# Patient Record
Sex: Female | Born: 1980 | Hispanic: No | Marital: Married | Smoking: Never smoker
Health system: Southern US, Community
[De-identification: ages and names within clinical notes are randomized; demographics above are authoritative.]

## PROBLEM LIST (undated history)

## (undated) ENCOUNTER — Inpatient Hospital Stay (HOSPITAL_COMMUNITY): Payer: Self-pay

## (undated) DIAGNOSIS — B999 Unspecified infectious disease: Secondary | ICD-10-CM

## (undated) DIAGNOSIS — O98419 Viral hepatitis complicating pregnancy, unspecified trimester: Secondary | ICD-10-CM

## (undated) DIAGNOSIS — B181 Chronic viral hepatitis B without delta-agent: Secondary | ICD-10-CM

## (undated) HISTORY — PX: NO PAST SURGERIES: SHX2092

---

## 2010-03-15 NOTE — L&D Delivery Note (Signed)
Delivery Note At 11:11 PM a viable female was delivered via Vaginal, Spontaneous Delivery (Presentation: LOA with tight nuchal cord  ).  APGAR: 6, 9; weight 7 lb 14.8 oz (3595 g).   Placenta status:spontaneous, intact, 3VC.  Cord:3VC  with the following complications: none  Cord pH: 7.23  Anesthesia:  None Episiotomy: none  Lacerations: none Est. Blood Loss (mL): 350 ml  Mom to postpartum.  Baby to nursery-stable.  Shelvy Perazzo 10/10/2010, 11:25 PM

## 2010-07-29 ENCOUNTER — Other Ambulatory Visit: Payer: Self-pay | Admitting: Physician Assistant

## 2010-07-29 ENCOUNTER — Other Ambulatory Visit: Payer: Self-pay | Admitting: Family Medicine

## 2010-07-29 DIAGNOSIS — Z3689 Encounter for other specified antenatal screening: Secondary | ICD-10-CM

## 2010-07-29 DIAGNOSIS — Z331 Pregnant state, incidental: Secondary | ICD-10-CM

## 2010-07-29 DIAGNOSIS — O0930 Supervision of pregnancy with insufficient antenatal care, unspecified trimester: Secondary | ICD-10-CM

## 2010-07-29 LAB — RUBELLA ANTIBODY, IGM: Rubella: IMMUNE

## 2010-07-29 LAB — STREP B DNA PROBE: GBS: NEGATIVE

## 2010-07-29 LAB — POCT URINALYSIS DIP (DEVICE)
Glucose, UA: NEGATIVE mg/dL
Nitrite: NEGATIVE
Protein, ur: NEGATIVE mg/dL
Specific Gravity, Urine: 1.025 (ref 1.005–1.030)
Urobilinogen, UA: 0.2 mg/dL (ref 0.0–1.0)

## 2010-07-29 LAB — RPR: RPR: NONREACTIVE

## 2010-07-29 LAB — HEPATITIS B SURFACE ANTIGEN: Hepatitis B Surface Ag: POSITIVE

## 2010-07-29 LAB — TYPE AND SCREEN: Antibody Screen: NEGATIVE

## 2010-07-30 ENCOUNTER — Ambulatory Visit (HOSPITAL_COMMUNITY)
Admission: RE | Admit: 2010-07-30 | Discharge: 2010-07-30 | Disposition: A | Discharge status: Home / Self Care | Payer: Medicaid Other | Source: Ambulatory Visit | Attending: Physician Assistant | Admitting: Physician Assistant

## 2010-07-30 DIAGNOSIS — O0930 Supervision of pregnancy with insufficient antenatal care, unspecified trimester: Secondary | ICD-10-CM | POA: Insufficient documentation

## 2010-07-30 DIAGNOSIS — Z1389 Encounter for screening for other disorder: Secondary | ICD-10-CM | POA: Insufficient documentation

## 2010-07-30 DIAGNOSIS — O358XX0 Maternal care for other (suspected) fetal abnormality and damage, not applicable or unspecified: Secondary | ICD-10-CM | POA: Insufficient documentation

## 2010-07-30 DIAGNOSIS — Z3689 Encounter for other specified antenatal screening: Secondary | ICD-10-CM

## 2010-07-30 DIAGNOSIS — Z363 Encounter for antenatal screening for malformations: Secondary | ICD-10-CM | POA: Insufficient documentation

## 2010-08-05 ENCOUNTER — Other Ambulatory Visit: Payer: Self-pay | Admitting: Obstetrics and Gynecology

## 2010-08-05 DIAGNOSIS — O0930 Supervision of pregnancy with insufficient antenatal care, unspecified trimester: Secondary | ICD-10-CM

## 2010-08-05 LAB — POCT URINALYSIS DIP (DEVICE)
Bilirubin Urine: NEGATIVE
Glucose, UA: NEGATIVE mg/dL
Hgb urine dipstick: NEGATIVE
Nitrite: NEGATIVE
Urobilinogen, UA: 0.2 mg/dL (ref 0.0–1.0)

## 2010-08-19 ENCOUNTER — Other Ambulatory Visit: Payer: Self-pay | Admitting: Obstetrics and Gynecology

## 2010-08-19 DIAGNOSIS — O0930 Supervision of pregnancy with insufficient antenatal care, unspecified trimester: Secondary | ICD-10-CM

## 2010-08-19 LAB — POCT URINALYSIS DIP (DEVICE)
Bilirubin Urine: NEGATIVE
Glucose, UA: NEGATIVE mg/dL
Ketones, ur: NEGATIVE mg/dL
Nitrite: NEGATIVE
pH: 6 (ref 5.0–8.0)

## 2010-08-26 ENCOUNTER — Other Ambulatory Visit: Payer: Self-pay | Admitting: Family

## 2010-08-26 DIAGNOSIS — O0930 Supervision of pregnancy with insufficient antenatal care, unspecified trimester: Secondary | ICD-10-CM

## 2010-08-26 DIAGNOSIS — O0940 Supervision of pregnancy with grand multiparity, unspecified trimester: Secondary | ICD-10-CM

## 2010-08-26 LAB — POCT URINALYSIS DIP (DEVICE)
Glucose, UA: NEGATIVE mg/dL
Ketones, ur: NEGATIVE mg/dL
Protein, ur: NEGATIVE mg/dL
Specific Gravity, Urine: 1.02 (ref 1.005–1.030)

## 2010-09-09 ENCOUNTER — Other Ambulatory Visit: Payer: Self-pay | Admitting: Obstetrics and Gynecology

## 2010-09-09 DIAGNOSIS — O0940 Supervision of pregnancy with grand multiparity, unspecified trimester: Secondary | ICD-10-CM

## 2010-09-09 DIAGNOSIS — O0930 Supervision of pregnancy with insufficient antenatal care, unspecified trimester: Secondary | ICD-10-CM

## 2010-09-09 LAB — POCT URINALYSIS DIP (DEVICE)
Bilirubin Urine: NEGATIVE
Glucose, UA: NEGATIVE mg/dL
Hgb urine dipstick: NEGATIVE
Specific Gravity, Urine: 1.025 (ref 1.005–1.030)
Urobilinogen, UA: 0.2 mg/dL (ref 0.0–1.0)

## 2010-09-23 ENCOUNTER — Other Ambulatory Visit: Payer: Self-pay | Admitting: Obstetrics and Gynecology

## 2010-09-23 DIAGNOSIS — O0930 Supervision of pregnancy with insufficient antenatal care, unspecified trimester: Secondary | ICD-10-CM

## 2010-09-23 DIAGNOSIS — O0940 Supervision of pregnancy with grand multiparity, unspecified trimester: Secondary | ICD-10-CM

## 2010-09-23 LAB — POCT URINALYSIS DIP (DEVICE)
Bilirubin Urine: NEGATIVE
Glucose, UA: NEGATIVE mg/dL
Ketones, ur: NEGATIVE mg/dL
Nitrite: NEGATIVE
pH: 6.5 (ref 5.0–8.0)

## 2010-09-30 ENCOUNTER — Other Ambulatory Visit: Payer: Self-pay | Admitting: Obstetrics and Gynecology

## 2010-09-30 DIAGNOSIS — O0940 Supervision of pregnancy with grand multiparity, unspecified trimester: Secondary | ICD-10-CM

## 2010-09-30 DIAGNOSIS — O0930 Supervision of pregnancy with insufficient antenatal care, unspecified trimester: Secondary | ICD-10-CM

## 2010-09-30 LAB — POCT URINALYSIS DIP (DEVICE)
Bilirubin Urine: NEGATIVE
Ketones, ur: NEGATIVE mg/dL
Specific Gravity, Urine: 1.02 (ref 1.005–1.030)
pH: 6 (ref 5.0–8.0)

## 2010-10-07 ENCOUNTER — Ambulatory Visit (INDEPENDENT_AMBULATORY_CARE_PROVIDER_SITE_OTHER): Payer: Medicaid Other | Admitting: Family Medicine

## 2010-10-07 ENCOUNTER — Other Ambulatory Visit: Payer: Self-pay | Admitting: Physician Assistant

## 2010-10-07 DIAGNOSIS — O0930 Supervision of pregnancy with insufficient antenatal care, unspecified trimester: Secondary | ICD-10-CM

## 2010-10-07 DIAGNOSIS — O09299 Supervision of pregnancy with other poor reproductive or obstetric history, unspecified trimester: Secondary | ICD-10-CM

## 2010-10-07 DIAGNOSIS — O480 Post-term pregnancy: Secondary | ICD-10-CM

## 2010-10-07 LAB — POCT URINALYSIS DIP (DEVICE)
Glucose, UA: NEGATIVE mg/dL
Hgb urine dipstick: NEGATIVE
Ketones, ur: NEGATIVE mg/dL
Specific Gravity, Urine: 1.025 (ref 1.005–1.030)

## 2010-10-10 ENCOUNTER — Encounter (HOSPITAL_COMMUNITY): Payer: Self-pay

## 2010-10-10 ENCOUNTER — Inpatient Hospital Stay (HOSPITAL_COMMUNITY)
Admission: AD | Admit: 2010-10-10 | Discharge: 2010-10-12 | DRG: 775 | Disposition: A | Discharge status: Home / Self Care | Payer: Medicaid Other | Source: Ambulatory Visit | Attending: Obstetrics & Gynecology | Admitting: Obstetrics & Gynecology

## 2010-10-10 HISTORY — DX: Unspecified infectious disease: B99.9

## 2010-10-10 HISTORY — DX: Viral hepatitis complicating pregnancy, unspecified trimester: O98.419

## 2010-10-10 HISTORY — DX: Chronic viral hepatitis B without delta-agent: B18.1

## 2010-10-10 LAB — CBC
MCV: 87.8 fL (ref 78.0–100.0)
Platelets: 156 10*3/uL (ref 150–400)
RDW: 13.7 % (ref 11.5–15.5)
WBC: 7.7 10*3/uL (ref 4.0–10.5)

## 2010-10-10 LAB — STREP B DNA PROBE: GBS: NEGATIVE

## 2010-10-10 MED ORDER — TERBUTALINE SULFATE 1 MG/ML IJ SOLN: 0.2500 mg | Freq: Once | INTRAMUSCULAR | Status: AC | PRN

## 2010-10-10 MED ORDER — OXYTOCIN 20 UNITS IN LACTATED RINGERS INFUSION - SIMPLE: 1.0000 m[IU]/min | INTRAVENOUS | Status: DC

## 2010-10-10 MED ORDER — LIDOCAINE HCL (PF) 1 % IJ SOLN
30.0000 mL | INTRAMUSCULAR | Status: DC | PRN
  Filled 2010-10-10 (×2): qty 30

## 2010-10-10 MED ORDER — OXYCODONE-ACETAMINOPHEN 5-325 MG PO TABS: 2.0000 | ORAL_TABLET | ORAL | Status: DC | PRN

## 2010-10-10 MED ORDER — OXYTOCIN 10 UNIT/ML IJ SOLN
INTRAMUSCULAR | Status: AC
  Filled 2010-10-10: qty 2

## 2010-10-10 MED ORDER — ONDANSETRON HCL 4 MG/2ML IJ SOLN: 4.0000 mg | Freq: Four times a day (QID) | INTRAMUSCULAR | Status: DC | PRN

## 2010-10-10 MED ORDER — FLEET ENEMA 7-19 GM/118ML RE ENEM: 1.0000 | ENEMA | RECTAL | Status: DC | PRN

## 2010-10-10 MED ORDER — ACETAMINOPHEN 325 MG PO TABS: 650.0000 mg | ORAL_TABLET | ORAL | Status: DC | PRN

## 2010-10-10 MED ORDER — OXYTOCIN 20 UNITS IN LACTATED RINGERS INFUSION - SIMPLE
125.0000 mL/h | Freq: Once | INTRAVENOUS | Status: AC
  Administered 2010-10-10: 500 mL/h via INTRAVENOUS
  Filled 2010-10-10: qty 1000

## 2010-10-10 MED ORDER — CITRIC ACID-SODIUM CITRATE 334-500 MG/5ML PO SOLN: 30.0000 mL | ORAL | Status: DC | PRN

## 2010-10-10 MED ORDER — IBUPROFEN 600 MG PO TABS: 600.0000 mg | ORAL_TABLET | Freq: Four times a day (QID) | ORAL | Status: DC | PRN

## 2010-10-10 MED ORDER — LACTATED RINGERS IV SOLN
INTRAVENOUS | Status: DC
  Administered 2010-10-10: 20:00:00 via INTRAVENOUS

## 2010-10-10 MED ORDER — LACTATED RINGERS IV SOLN: 500.0000 mL | INTRAVENOUS | Status: DC | PRN

## 2010-10-10 NOTE — H&P (Signed)
Renee Zavala is a 30 y.o. female [id number] with IUP at [redacted]w[redacted]d presenting for active labor. Pt states she has been having regular, every 2-5 minutes, associated with none vaginal bleeding, intact, with active.  She desires to Depo-Provera. She desires to breast and bottle feed.  [facility] since 28.2 wks  Prenatal History/Complications: Grand multiparity Hep B +  Past Medical History: Past Medical History  Diagnosis Date  . Hepatitis B surface antigen positive, currently pregnant   . Infection     Past Surgical History: Past Surgical History  Procedure Date  . No past surgeries     Obstetrical History: OB History    Grav Para Term Preterm Abortions TAB SAB Ect Mult Living   6 5 5  0 0 0 0 0 0 5      Gynecological History: No STIs, or HSV  Social History: History   Social History  . Marital Status: Married    Spouse Name: N/A    Number of Children: N/A  . Years of Education: N/A   Social History Main Topics  . Smoking status: Never Smoker   . Smokeless tobacco: None  . Alcohol Use: No  . Drug Use: No  . Sexually Active: Yes   Other Topics Concern  . None   Social History Narrative  . None    Family History: No family history on file.  Allergies: No Known Allergies  No prescriptions prior to admission    Review of Systems - Negative except per HPI, no s/sx of PreE.   Blood pressure 128/95, pulse 105, temperature 98.8 F (37.1 C), temperature source Oral, resp. rate 20, last menstrual period 12/30/2009. General appearance: alert and cooperative Lungs: clear to auscultation bilaterally Heart: regular rate and rhythm, S1, S2 normal, no murmur, click, rub or gallop Abdomen: soft, non-tender; bowel sounds normal; no masses,  no organomegaly and gravid, size cwd, vertex by leopolds, EFW 6.5lbs-7lbs by leopolds. Extremities: extremities normal, atraumatic, no cyanosis or edema Skin: Skin color, texture, turgor normal. No rashes or lesions cephalic FHTs:  150 bpm, mod var, no accel, var decel with contractions, cat 2 Frequency: Every 2-3 minutes Dilation: 8.5 Effacement (%): 100 Station: -1 Exam by:: LCarpenter,RN   Prenatal labs: ABO, Rh:A pos   Antibody: Negative (05/16 0000) Rubella: immune   RPR: Nonreactive (05/16 0000)  HBsAg: Positive (05/16 0000)  HIV: Non-reactive (05/16 0000)  GBS: Negative (07/28 0000)  1 hr Glucola 92 Genetic screening  Anatomy US nl   Assessment: Renee Zavala is a 30 y.o. [id number] with an IUP at [redacted]w[redacted]d presenting for active labor.  Plan: 1. Admit to labor and delivery for expectant management.    Suzanne Kho 10/10/2010, 8:17 PM

## 2010-10-10 NOTE — Progress Notes (Signed)
Arrived to Rm 167 from admitting.  Had arrived to [facility] via EMS.  Met family in hallway & they accompanied to L&D.  Initial FHR 130s.  Admitted to [facility] @ 1920.  See paper strip for tracing between 1855-1920.

## 2010-10-10 NOTE — Progress Notes (Signed)
Renee Zavala is a 30 y.o. [id number] at [redacted]w[redacted]d admitted for active labor  Subjective: Feeling pressure  Objective: BP 103/67  Pulse 99  Temp(Src) 98.8 F (37.1 C) (Oral)  Resp 20  Ht 5\' 3"  (1.6 m)  Wt 65.772 kg (145 lb)  BMI 25.69 kg/m2  LMP 12/30/2009      FHT:  150 bpm, mod var, no accels, +var decels with contractions UC:   regular, every 2-3 minutes SVE:   Dilation: 9 Effacement (%): 90 Station: 0 Exam by:: Dr. Orvan Falconer  Labs: Lab Results  Component Value Date   WBC 7.7 10/10/2010   HGB 10.9* 10/10/2010   HCT 32.5* 10/10/2010   MCV 87.8 10/10/2010   PLT 156 10/10/2010    Assessment / Plan: Spontaneous labor, progressing normally  Labor: Progressing normally and s/p SROM clr Fetal Wellbeing:  Category II Pain Control:  Labor support without medications I/D:  n/a Anticipated MOD:  NSVD  Renee Zavala 10/10/2010, 9:38 PM

## 2010-10-10 NOTE — Treatment Plan (Signed)
Report & pt care to Emilio Math, RN

## 2010-10-10 NOTE — Treatment Plan (Signed)
[employer] # [phone] (Swahili) used for admission

## 2010-10-10 NOTE — Plan of Care (Signed)
Problem: Consults Goal: Birthing Suites Patient Information Press F2 to bring up selections list   Pt > [redacted] weeks EGA     

## 2010-10-10 NOTE — Progress Notes (Signed)
Renee Zavala is a 30 y.o. [id number] at [redacted]w[redacted]d admitted for active labor  Subjective: Pt feeling some pressure  Objective: BP 103/67  Pulse 99  Temp(Src) 98.8 F (37.1 C) (Oral)  Resp 20  Ht 5\' 3"  (1.6 m)  Wt 65.772 kg (145 lb)  BMI 25.69 kg/m2  LMP 12/30/2009      FHT:  FHR: 150 bpm, variability: moderate,  accelerations:  Abscent,  decelerations:  Present early decel with contractions.  UC:   Unsure due to pt ambulation.  SVE:   Dilation: 9 Effacement (%): 90 Station: 0 Exam by:: Dr. Orvan Falconer  Labs: Lab Results  Component Value Date   WBC 7.7 10/10/2010   HGB 10.9* 10/10/2010   HCT 32.5* 10/10/2010   MCV 87.8 10/10/2010   PLT 156 10/10/2010    Assessment / Plan: Spontaneous labor, progressing normally  Labor: protracted active phase will place IUPC. to monitor contractions.  Fetal Wellbeing:  Category II Pain Control:  Labor support without medications I/D:  n/a Anticipated MOD:  NSVD  Renee Zavala 10/10/2010, 10:28 PM

## 2010-10-10 NOTE — Progress Notes (Signed)
Pt feels the urge to push

## 2010-10-11 MED ORDER — TETANUS-DIPHTH-ACELL PERTUSSIS 5-2.5-18.5 LF-MCG/0.5 IM SUSP
0.5000 mL | Freq: Once | INTRAMUSCULAR | Status: AC
  Administered 2010-10-11: 0.5 mL via INTRAMUSCULAR
  Filled 2010-10-11: qty 0.5

## 2010-10-11 MED ORDER — DIPHENHYDRAMINE HCL 25 MG PO CAPS: 25.0000 mg | ORAL_CAPSULE | Freq: Four times a day (QID) | ORAL | Status: DC | PRN

## 2010-10-11 MED ORDER — SENNOSIDES-DOCUSATE SODIUM 8.6-50 MG PO TABS
1.0000 | ORAL_TABLET | Freq: Every day | ORAL | Status: DC
  Administered 2010-10-11: 1 via ORAL

## 2010-10-11 MED ORDER — IBUPROFEN 600 MG PO TABS
600.0000 mg | ORAL_TABLET | Freq: Four times a day (QID) | ORAL | Status: DC
  Administered 2010-10-11 – 2010-10-12 (×5): 600 mg via ORAL
  Filled 2010-10-11 (×5): qty 1

## 2010-10-11 MED ORDER — SODIUM CHLORIDE 0.9 % IV SOLN: 250.0000 mL | INTRAVENOUS | Status: DC

## 2010-10-11 MED ORDER — SIMETHICONE 80 MG PO CHEW: 80.0000 mg | CHEWABLE_TABLET | ORAL | Status: DC | PRN

## 2010-10-11 MED ORDER — LANOLIN HYDROUS EX OINT: TOPICAL_OINTMENT | CUTANEOUS | Status: DC | PRN

## 2010-10-11 MED ORDER — SODIUM CHLORIDE 0.9 % IJ SOLN: 3.0000 mL | INTRAMUSCULAR | Status: DC | PRN

## 2010-10-11 MED ORDER — FERROUS SULFATE 325 (65 FE) MG PO TABS
325.0000 mg | ORAL_TABLET | Freq: Two times a day (BID) | ORAL | Status: DC
  Administered 2010-10-11 – 2010-10-12 (×3): 325 mg via ORAL
  Filled 2010-10-11 (×3): qty 1

## 2010-10-11 MED ORDER — BENZOCAINE-MENTHOL 20-0.5 % EX AERO: 1.0000 "application " | INHALATION_SPRAY | CUTANEOUS | Status: DC | PRN

## 2010-10-11 MED ORDER — SODIUM CHLORIDE 0.9 % IJ SOLN: 3.0000 mL | Freq: Two times a day (BID) | INTRAMUSCULAR | Status: DC

## 2010-10-11 MED ORDER — PRENATAL PLUS 27-1 MG PO TABS
1.0000 | ORAL_TABLET | Freq: Every day | ORAL | Status: DC
  Administered 2010-10-11 – 2010-10-12 (×2): 1 via ORAL
  Filled 2010-10-11 (×2): qty 1

## 2010-10-11 MED ORDER — OXYCODONE-ACETAMINOPHEN 5-325 MG PO TABS
1.0000 | ORAL_TABLET | ORAL | Status: DC | PRN
  Administered 2010-10-11: 1 via ORAL
  Filled 2010-10-11 (×3): qty 2

## 2010-10-11 MED ORDER — WITCH HAZEL-GLYCERIN EX PADS: MEDICATED_PAD | CUTANEOUS | Status: DC | PRN

## 2010-10-11 NOTE — Progress Notes (Signed)
SVD female, apgar 6/9

## 2010-10-11 NOTE — Progress Notes (Signed)
Post Partum Day 1 (early) Subjective: no complaints and tolerating PO, normal lochia, absent BM, absent flatus, plans to breastfeed, plans to bottle feed, Depo-Provera  Objective: Blood pressure 92/57, pulse 70, temperature 98.8 F (37.1 C), temperature source Oral, resp. rate 20, height 5\' 3"  (1.6 m), weight 65.772 kg (145 lb), last menstrual period 12/30/2009, unknown if currently breastfeeding.  Physical Exam:  General: alert and cooperative Lochia: appropriate Chest: CTAB Heart: RRR no m/r/g Abdomen: +BS, soft, nontender,  Uterine Fundus: firm @ umbilicua DVT Evaluation: No evidence of DVT seen on physical exam. Extremities: no c/c/e   [facility] 10/10/10 1915  HGB 10.9*  HCT 32.5*    Assessment/Plan: Plan for discharge tomorrow Continue routine pp care.    LOS: 1 day   Aretta Stetzel 10/11/2010, 7:02 AM

## 2010-10-11 NOTE — Progress Notes (Signed)
Experienced breastfeeding mother x 46yrs with each child. Observed good flow of colostrum and good latch. Lots of teaching with family member interpreting for me. Gave hand pump with inst if needed.

## 2010-10-12 ENCOUNTER — Other Ambulatory Visit: Payer: Medicaid Other

## 2010-10-12 MED ORDER — IBUPROFEN 600 MG PO TABS: 600.0000 mg | ORAL_TABLET | Freq: Four times a day (QID) | ORAL | Status: AC

## 2010-10-12 MED ORDER — DOCUSATE SODIUM 100 MG PO CAPS: 100.0000 mg | ORAL_CAPSULE | Freq: Two times a day (BID) | ORAL | Status: AC

## 2010-10-12 MED ORDER — FERROUS SULFATE 325 (65 FE) MG PO TABS: 325.0000 mg | ORAL_TABLET | Freq: Two times a day (BID) | ORAL | Status: DC

## 2010-10-12 NOTE — Discharge Summary (Signed)
  Obstetric Discharge Summary Reason for Admission: onset of labor Prenatal Procedures: ultrasound Intrapartum Procedures: spontaneous vaginal delivery Postpartum Procedures: none Complications-Operative and Postpartum: none  Hemoglobin  Date Value Range Status  10/10/2010 10.9* 12.0-15.0 (g/dL) Final     HCT  Date Value Range Status  10/10/2010 32.5* 36.0-46.0 (%) Final    Discharge Diagnoses: Term Pregnancy-delivered  Discharge Information: Date: 10/12/2010 Activity: pelvic rest Diet: routine Medications: Ibuprophen, Colace and Iron Condition: stable Instructions: refer to practice specific booklet Discharge to: home   Newborn Data: Live born  Information for the patient's newborn:  Ronnell, Makarewicz [[id number]]  female ; APGAR 6/9 , ; weight 7lb14.8;  Home with mother.  Marena Chancy 10/12/2010, 7:27 AM

## 2010-10-12 NOTE — Progress Notes (Signed)
UR chart review completed.  

## 2010-10-12 NOTE — Progress Notes (Signed)
  Post Partum Day 2 Subjective: no complaints, up ad lib, voiding, tolerating PO and pain controled with meds. minimal lochia. Breastfeeding. Plans on depo for birth control.  Objective: Blood pressure 117/80, pulse 55, temperature 98.2 F (36.8 C), temperature source Oral, resp. rate 18, height 5\' 3"  (1.6 m), weight 145 lb (65.772 kg), last menstrual period 12/30/2009, unknown if currently breastfeeding.  Physical Exam:  General: alert, cooperative and no distress Lochia: appropriate Uterine Fundus: firm, at umbilicus DVT Evaluation: No evidence of DVT seen on physical exam. Negative Homan's sign.   [facility] 10/10/10 1915  HGB 10.9*  HCT 32.5*    Assessment/Plan: Discharge home, pt doing well. Discharge on ferrous sulfate, colace and ibuprofen.  6 week post partum visit at [facility] clinic   LOS: 2 days   Atul Delucia 10/12/2010, 7:22 AM

## 2010-10-12 NOTE — Progress Notes (Signed)
SW referral received to assist pt with a car seat.  Volunteer services referral made.  

## 2010-10-21 NOTE — Discharge Summary (Signed)
Agree with above note.  Renee Zavala 10/21/2010 4:40 PM

## 2010-12-11 ENCOUNTER — Inpatient Hospital Stay (INDEPENDENT_AMBULATORY_CARE_PROVIDER_SITE_OTHER)
Admission: RE | Admit: 2010-12-11 | Discharge: 2010-12-11 | Disposition: A | Discharge status: Home / Self Care | Payer: Medicaid Other | Source: Ambulatory Visit | Attending: Family Medicine | Admitting: Family Medicine

## 2010-12-11 DIAGNOSIS — I10 Essential (primary) hypertension: Secondary | ICD-10-CM

## 2010-12-11 LAB — POCT I-STAT, CHEM 8
Calcium, Ion: 1.05 mmol/L — ABNORMAL LOW (ref 1.12–1.32)
Glucose, Bld: 89 mg/dL (ref 70–99)
HCT: 40 % (ref 36.0–46.0)
Hemoglobin: 13.6 g/dL (ref 12.0–15.0)
TCO2: 25 mmol/L (ref 0–100)

## 2012-03-15 NOTE — L&D Delivery Note (Signed)
Delivery Note At 7:04 PM a viable female was delivered via Vaginal, Spontaneous Delivery (Presentation: Left Occiput Anterior).  APGAR: 9, 9; weight - pending. Placenta status: Intact, Spontaneous.  Cord: 3 vessels with the following complications: None.  Cord pH: N/A  Anesthesia: None  Episiotomy: None Lacerations: None; slight, small vaginal abrasion of the posterior fourchette noted. Suture Repair: N/A Est. Blood Loss (mL): 100  Mom to postpartum.  Baby to nursery-stable.  Everlene Other 08/22/2012, 7:48 PM     Evaluation and management procedures were performed by Resident physician under my supervision/collaboration. Chart reviewed, patient examined by me and I agree with management and plan.Marland Kitchen

## 2012-03-15 NOTE — L&D Delivery Note (Signed)
Attestation of Attending Supervision of Advanced Practitioner (PA/CNM/NP): Evaluation and management procedures were performed by the Advanced Practitioner under my supervision and collaboration.  I have reviewed the Advanced Practitioner's note and chart, and I agree with the management and plan.  Tamario Heal, MD, FACOG Attending Obstetrician & Gynecologist Faculty Practice, [facility]  

## 2012-05-05 ENCOUNTER — Other Ambulatory Visit: Payer: Self-pay | Admitting: Obstetrics & Gynecology

## 2012-05-05 ENCOUNTER — Other Ambulatory Visit (INDEPENDENT_AMBULATORY_CARE_PROVIDER_SITE_OTHER): Payer: Medicaid Other | Admitting: Obstetrics and Gynecology

## 2012-05-05 DIAGNOSIS — Z3201 Encounter for pregnancy test, result positive: Secondary | ICD-10-CM

## 2012-05-22 ENCOUNTER — Encounter (INDEPENDENT_AMBULATORY_CARE_PROVIDER_SITE_OTHER): Payer: Medicaid Other | Admitting: Obstetrics & Gynecology

## 2012-05-22 ENCOUNTER — Other Ambulatory Visit: Payer: Medicaid Other

## 2012-05-22 DIAGNOSIS — Z3490 Encounter for supervision of normal pregnancy, unspecified, unspecified trimester: Secondary | ICD-10-CM

## 2012-05-24 LAB — OBSTETRIC PANEL
Eosinophils Absolute: 0.1 10*3/uL (ref 0.0–0.7)
Eosinophils Relative: 2 % (ref 0–5)
Lymphs Abs: 1.2 10*3/uL (ref 0.7–4.0)
MCH: 30 pg (ref 26.0–34.0)
MCHC: 33.8 g/dL (ref 30.0–36.0)
MCV: 88.9 fL (ref 78.0–100.0)
Platelets: 162 10*3/uL (ref 150–400)
RBC: 3.43 MIL/uL — ABNORMAL LOW (ref 3.87–5.11)
RDW: 13.7 % (ref 11.5–15.5)

## 2012-05-24 LAB — HIV ANTIBODY (ROUTINE TESTING W REFLEX): HIV: NONREACTIVE

## 2012-05-24 LAB — HEMOGLOBINOPATHY EVALUATION
Hgb A: 97.2 % (ref 96.8–97.8)
Hgb S Quant: 0 %

## 2012-05-28 ENCOUNTER — Encounter: Payer: Self-pay | Admitting: Obstetrics & Gynecology

## 2012-05-28 DIAGNOSIS — B181 Chronic viral hepatitis B without delta-agent: Secondary | ICD-10-CM | POA: Insufficient documentation

## 2012-05-29 ENCOUNTER — Ambulatory Visit (INDEPENDENT_AMBULATORY_CARE_PROVIDER_SITE_OTHER): Payer: Medicaid Other | Admitting: Obstetrics and Gynecology

## 2012-05-29 ENCOUNTER — Other Ambulatory Visit: Payer: Self-pay | Admitting: Family Medicine

## 2012-05-29 ENCOUNTER — Other Ambulatory Visit (HOSPITAL_COMMUNITY)
Admission: RE | Admit: 2012-05-29 | Discharge: 2012-05-29 | Disposition: A | Discharge status: Home / Self Care | Payer: Medicaid Other | Source: Ambulatory Visit | Attending: Obstetrics and Gynecology | Admitting: Obstetrics and Gynecology

## 2012-05-29 ENCOUNTER — Encounter: Payer: Self-pay | Admitting: Obstetrics and Gynecology

## 2012-05-29 ENCOUNTER — Telehealth: Payer: Self-pay | Admitting: *Deleted

## 2012-05-29 VITALS — BP 125/77 | Temp 98.7°F | Wt 158.3 lb

## 2012-05-29 DIAGNOSIS — O0940 Supervision of pregnancy with grand multiparity, unspecified trimester: Secondary | ICD-10-CM | POA: Insufficient documentation

## 2012-05-29 DIAGNOSIS — Z113 Encounter for screening for infections with a predominantly sexual mode of transmission: Secondary | ICD-10-CM | POA: Insufficient documentation

## 2012-05-29 DIAGNOSIS — Z1151 Encounter for screening for human papillomavirus (HPV): Secondary | ICD-10-CM | POA: Insufficient documentation

## 2012-05-29 DIAGNOSIS — Z01419 Encounter for gynecological examination (general) (routine) without abnormal findings: Secondary | ICD-10-CM | POA: Insufficient documentation

## 2012-05-29 DIAGNOSIS — B181 Chronic viral hepatitis B without delta-agent: Secondary | ICD-10-CM

## 2012-05-29 LAB — POCT URINALYSIS DIP (DEVICE)
Ketones, ur: NEGATIVE mg/dL
Protein, ur: 30 mg/dL — AB
Specific Gravity, Urine: 1.025 (ref 1.005–1.030)
pH: 7 (ref 5.0–8.0)

## 2012-05-29 LAB — COMPREHENSIVE METABOLIC PANEL
ALT: 12 U/L (ref 0–35)
Albumin: 3.3 g/dL — ABNORMAL LOW (ref 3.5–5.2)
CO2: 24 mEq/L (ref 19–32)
Calcium: 7.9 mg/dL — ABNORMAL LOW (ref 8.4–10.5)
Chloride: 104 mEq/L (ref 96–112)
Sodium: 136 mEq/L (ref 135–145)
Total Protein: 6.5 g/dL (ref 6.0–8.3)

## 2012-05-29 MED ORDER — PRENATAL VITAMINS PLUS 27-1 MG PO TABS: 1.0000 | ORAL_TABLET | Freq: Every day | ORAL | Status: DC

## 2012-05-29 NOTE — Patient Instructions (Addendum)
Pregnancy - Second Trimester The second trimester of pregnancy (3 to 6 months) is a period of rapid growth for you and your baby. At the end of the sixth month, your baby is about 9 inches long and weighs 1 1/2 pounds. You will begin to feel the baby move between 18 and 20 weeks of the pregnancy. This is called quickening. Weight gain is faster. A clear fluid (colostrum) may leak out of your breasts. You may feel small contractions of the womb (uterus). This is known as false labor or Braxton-Hicks contractions. This is like a practice for labor when the baby is ready to be born. Usually, the problems with morning sickness have usually passed by the end of your first trimester. Some women develop small dark blotches (called cholasma, mask of pregnancy) on their face that usually goes away after the baby is born. Exposure to the sun makes the blotches worse. Acne may also develop in some pregnant women and pregnant women who have acne, may find that it goes away. PRENATAL EXAMS  Blood work may continue to be done during prenatal exams. These tests are done to check on your health and the probable health of your baby. Blood work is used to follow your blood levels (hemoglobin). Anemia (low hemoglobin) is common during pregnancy. Iron and vitamins are given to help prevent this. You will also be checked for diabetes between 24 and 28 weeks of the pregnancy. Some of the previous blood tests may be repeated.  The size of the uterus is measured during each visit. This is to make sure that the baby is continuing to grow properly according to the dates of the pregnancy.  Your blood pressure is checked every prenatal visit. This is to make sure you are not getting toxemia.  Your urine is checked to make sure you do not have an infection, diabetes or protein in the urine.  Your weight is checked often to make sure gains are happening at the suggested rate. This is to ensure that both you and your baby are growing  normally.  Sometimes, an ultrasound is performed to confirm the proper growth and development of the baby. This is a test which bounces harmless sound waves off the baby so your caregiver can more accurately determine due dates. Sometimes, a specialized test is done on the amniotic fluid surrounding the baby. This test is called an amniocentesis. The amniotic fluid is obtained by sticking a needle into the belly (abdomen). This is done to check the chromosomes in instances where there is a concern about possible genetic problems with the baby. It is also sometimes done near the end of pregnancy if an early delivery is required. In this case, it is done to help make sure the baby's lungs are mature enough for the baby to live outside of the womb. CHANGES OCCURING IN THE SECOND TRIMESTER OF PREGNANCY Your body goes through many changes during pregnancy. They vary from person to person. Talk to your caregiver about changes you notice that you are concerned about.  During the second trimester, you will likely have an increase in your appetite. It is normal to have cravings for certain foods. This varies from person to person and pregnancy to pregnancy.  Your lower abdomen will begin to bulge.  You may have to urinate more often because the uterus and baby are pressing on your bladder. It is also common to get more bladder infections during pregnancy (pain with urination). You can help this by   drinking lots of fluids and emptying your bladder before and after intercourse.  You may begin to get stretch marks on your hips, abdomen, and breasts. These are normal changes in the body during pregnancy. There are no exercises or medications to take that prevent this change.  You may begin to develop swollen and bulging veins (varicose veins) in your legs. Wearing support hose, elevating your feet for 15 minutes, 3 to 4 times a day and limiting salt in your diet helps lessen the problem.  Heartburn may develop  as the uterus grows and pushes up against the stomach. Antacids recommended by your caregiver helps with this problem. Also, eating smaller meals 4 to 5 times a day helps.  Constipation can be treated with a stool softener or adding bulk to your diet. Drinking lots of fluids, vegetables, fruits, and whole grains are helpful.  Exercising is also helpful. If you have been very active up until your pregnancy, most of these activities can be continued during your pregnancy. If you have been less active, it is helpful to start an exercise program such as walking.  Hemorrhoids (varicose veins in the rectum) may develop at the end of the second trimester. Warm sitz baths and hemorrhoid cream recommended by your caregiver helps hemorrhoid problems.  Backaches may develop during this time of your pregnancy. Avoid heavy lifting, wear low heal shoes and practice good posture to help with backache problems.  Some pregnant women develop tingling and numbness of their hand and fingers because of swelling and tightening of ligaments in the wrist (carpel tunnel syndrome). This goes away after the baby is born.  As your breasts enlarge, you may have to get a bigger bra. Get a comfortable, cotton, support bra. Do not get a nursing bra until the last month of the pregnancy if you will be nursing the baby.  You may get a dark line from your belly button to the pubic area called the linea nigra.  You may develop rosy cheeks because of increase blood flow to the face.  You may develop spider looking lines of the face, neck, arms and chest. These go away after the baby is born. HOME CARE INSTRUCTIONS   It is extremely important to avoid all smoking, herbs, alcohol, and unprescribed drugs during your pregnancy. These chemicals affect the formation and growth of the baby. Avoid these chemicals throughout the pregnancy to ensure the delivery of a healthy infant.  Most of your home care instructions are the same as  suggested for the first trimester of your pregnancy. Keep your caregiver's appointments. Follow your caregiver's instructions regarding medication use, exercise and diet.  During pregnancy, you are providing food for you and your baby. Continue to eat regular, well-balanced meals. Choose foods such as meat, fish, milk and other low fat dairy products, vegetables, fruits, and whole-grain breads and cereals. Your caregiver will tell you of the ideal weight gain.  A physical sexual relationship may be continued up until near the end of pregnancy if there are no other problems. Problems could include early (premature) leaking of amniotic fluid from the membranes, vaginal bleeding, abdominal pain, or other medical or pregnancy problems.  Exercise regularly if there are no restrictions. Check with your caregiver if you are unsure of the safety of some of your exercises. The greatest weight gain will occur in the last 2 trimesters of pregnancy. Exercise will help you:  Control your weight.  Get you in shape for labor and delivery.  Lose weight   after you have the baby.  Wear a good support or jogging bra for breast tenderness during pregnancy. This may help if worn during sleep. Pads or tissues may be used in the bra if you are leaking colostrum.  Do not use hot tubs, steam rooms or saunas throughout the pregnancy.  Wear your seat belt at all times when driving. This protects you and your baby if you are in an accident.  Avoid raw meat, uncooked cheese, cat litter boxes and soil used by cats. These carry germs that can cause birth defects in the baby.  The second trimester is also a good time to visit your dentist for your dental health if this has not been done yet. Getting your teeth cleaned is OK. Use a soft toothbrush. Brush gently during pregnancy.  It is easier to loose urine during pregnancy. Tightening up and strengthening the pelvic muscles will help with this problem. Practice stopping your  urination while you are going to the bathroom. These are the same muscles you need to strengthen. It is also the muscles you would use as if you were trying to stop from passing gas. You can practice tightening these muscles up 10 times a set and repeating this about 3 times per day. Once you know what muscles to tighten up, do not perform these exercises during urination. It is more likely to contribute to an infection by backing up the urine.  Ask for help if you have financial, counseling or nutritional needs during pregnancy. Your caregiver will be able to offer counseling for these needs as well as refer you for other special needs.  Your skin may become oily. If so, wash your face with mild soap, use non-greasy moisturizer and oil or cream based makeup. MEDICATIONS AND DRUG USE IN PREGNANCY  Take prenatal vitamins as directed. The vitamin should contain 1 milligram of folic acid. Keep all vitamins out of reach of children. Only a couple vitamins or tablets containing iron may be fatal to a baby or young child when ingested.  Avoid use of all medications, including herbs, over-the-counter medications, not prescribed or suggested by your caregiver. Only take over-the-counter or prescription medicines for pain, discomfort, or fever as directed by your caregiver. Do not use aspirin.  Let your caregiver also know about herbs you may be using.  Alcohol is related to a number of birth defects. This includes fetal alcohol syndrome. All alcohol, in any form, should be avoided completely. Smoking will cause low birth rate and premature babies.  Street or illegal drugs are very harmful to the baby. They are absolutely forbidden. A baby born to an addicted mother will be addicted at birth. The baby will go through the same withdrawal an adult does. SEEK MEDICAL CARE IF:  You have any concerns or worries during your pregnancy. It is better to call with your questions if you feel they cannot wait, rather  than worry about them. SEEK IMMEDIATE MEDICAL CARE IF:   An unexplained oral temperature above 102 F (38.9 C) develops, or as your caregiver suggests.  You have leaking of fluid from the vagina (birth canal). If leaking membranes are suspected, take your temperature and tell your caregiver of this when you call.  There is vaginal spotting, bleeding, or passing clots. Tell your caregiver of the amount and how many pads are used. Light spotting in pregnancy is common, especially following intercourse.  You develop a bad smelling vaginal discharge with a change in the color from clear   to white.  You continue to feel sick to your stomach (nauseated) and have no relief from remedies suggested. You vomit blood or coffee ground-like materials.  You lose more than 2 pounds of weight or gain more than 2 pounds of weight over 1 week, or as suggested by your caregiver.  You notice swelling of your face, hands, feet, or legs.  You get exposed to [place] measles and have never had them.  You are exposed to fifth disease or chickenpox.  You develop belly (abdominal) pain. Round ligament discomfort is a common non-cancerous (benign) cause of abdominal pain in pregnancy. Your caregiver still must evaluate you.  You develop a bad headache that does not go away.  You develop fever, diarrhea, pain with urination, or shortness of breath.  You develop visual problems, blurry, or double vision.  You fall or are in a car accident or any kind of trauma.  There is mental or physical violence at home. Document Released: 02/23/2001 Document Revised: 05/24/2011 Document Reviewed: 08/28/2008 [facility] Patient Information 2013 [place], [place].  Pregnancy - Second Trimester The second trimester of pregnancy (3 to 6 months) is a period of rapid growth for you and your baby. At the end of the sixth month, your baby is about 9 inches long and weighs 1 1/2 pounds. You will begin to feel the baby move between 18 and  20 weeks of the pregnancy. This is called quickening. Weight gain is faster. A clear fluid (colostrum) may leak out of your breasts. You may feel small contractions of the womb (uterus). This is known as false labor or Braxton-Hicks contractions. This is like a practice for labor when the baby is ready to be born. Usually, the problems with morning sickness have usually passed by the end of your first trimester. Some women develop small dark blotches (called cholasma, mask of pregnancy) on their face that usually goes away after the baby is born. Exposure to the sun makes the blotches worse. Acne may also develop in some pregnant women and pregnant women who have acne, may find that it goes away. PRENATAL EXAMS  Blood work may continue to be done during prenatal exams. These tests are done to check on your health and the probable health of your baby. Blood work is used to follow your blood levels (hemoglobin). Anemia (low hemoglobin) is common during pregnancy. Iron and vitamins are given to help prevent this. You will also be checked for diabetes between 24 and 28 weeks of the pregnancy. Some of the previous blood tests may be repeated.  The size of the uterus is measured during each visit. This is to make sure that the baby is continuing to grow properly according to the dates of the pregnancy.  Your blood pressure is checked every prenatal visit. This is to make sure you are not getting toxemia.  Your urine is checked to make sure you do not have an infection, diabetes or protein in the urine.  Your weight is checked often to make sure gains are happening at the suggested rate. This is to ensure that both you and your baby are growing normally.  Sometimes, an ultrasound is performed to confirm the proper growth and development of the baby. This is a test which bounces harmless sound waves off the baby so your caregiver can more accurately determine due dates. Sometimes, a specialized test is done on  the amniotic fluid surrounding the baby. This test is called an amniocentesis. The amniotic fluid is obtained by  sticking a needle into the belly (abdomen). This is done to check the chromosomes in instances where there is a concern about possible genetic problems with the baby. It is also sometimes done near the end of pregnancy if an early delivery is required. In this case, it is done to help make sure the baby's lungs are mature enough for the baby to live outside of the womb. CHANGES OCCURING IN THE SECOND TRIMESTER OF PREGNANCY Your body goes through many changes during pregnancy. They vary from person to person. Talk to your caregiver about changes you notice that you are concerned about.  During the second trimester, you will likely have an increase in your appetite. It is normal to have cravings for certain foods. This varies from person to person and pregnancy to pregnancy.  Your lower abdomen will begin to bulge.  You may have to urinate more often because the uterus and baby are pressing on your bladder. It is also common to get more bladder infections during pregnancy (pain with urination). You can help this by drinking lots of fluids and emptying your bladder before and after intercourse.  You may begin to get stretch marks on your hips, abdomen, and breasts. These are normal changes in the body during pregnancy. There are no exercises or medications to take that prevent this change.  You may begin to develop swollen and bulging veins (varicose veins) in your legs. Wearing support hose, elevating your feet for 15 minutes, 3 to 4 times a day and limiting salt in your diet helps lessen the problem.  Heartburn may develop as the uterus grows and pushes up against the stomach. Antacids recommended by your caregiver helps with this problem. Also, eating smaller meals 4 to 5 times a day helps.  Constipation can be treated with a stool softener or adding bulk to your diet. Drinking lots of  fluids, vegetables, fruits, and whole grains are helpful.  Exercising is also helpful. If you have been very active up until your pregnancy, most of these activities can be continued during your pregnancy. If you have been less active, it is helpful to start an exercise program such as walking.  Hemorrhoids (varicose veins in the rectum) may develop at the end of the second trimester. Warm sitz baths and hemorrhoid cream recommended by your caregiver helps hemorrhoid problems.  Backaches may develop during this time of your pregnancy. Avoid heavy lifting, wear low heal shoes and practice good posture to help with backache problems.  Some pregnant women develop tingling and numbness of their hand and fingers because of swelling and tightening of ligaments in the wrist (carpel tunnel syndrome). This goes away after the baby is born.  As your breasts enlarge, you may have to get a bigger bra. Get a comfortable, cotton, support bra. Do not get a nursing bra until the last month of the pregnancy if you will be nursing the baby.  You may get a dark line from your belly button to the pubic area called the linea nigra.  You may develop rosy cheeks because of increase blood flow to the face.  You may develop spider looking lines of the face, neck, arms and chest. These go away after the baby is born. HOME CARE INSTRUCTIONS   It is extremely important to avoid all smoking, herbs, alcohol, and unprescribed drugs during your pregnancy. These chemicals affect the formation and growth of the baby. Avoid these chemicals throughout the pregnancy to ensure the delivery of a healthy infant.  Most of your home care instructions are the same as suggested for the first trimester of your pregnancy. Keep your caregiver's appointments. Follow your caregiver's instructions regarding medication use, exercise and diet.  During pregnancy, you are providing food for you and your baby. Continue to eat regular,  well-balanced meals. Choose foods such as meat, fish, milk and other low fat dairy products, vegetables, fruits, and whole-grain breads and cereals. Your caregiver will tell you of the ideal weight gain.  A physical sexual relationship may be continued up until near the end of pregnancy if there are no other problems. Problems could include early (premature) leaking of amniotic fluid from the membranes, vaginal bleeding, abdominal pain, or other medical or pregnancy problems.  Exercise regularly if there are no restrictions. Check with your caregiver if you are unsure of the safety of some of your exercises. The greatest weight gain will occur in the last 2 trimesters of pregnancy. Exercise will help you:  Control your weight.  Get you in shape for labor and delivery.  Lose weight after you have the baby.  Wear a good support or jogging bra for breast tenderness during pregnancy. This may help if worn during sleep. Pads or tissues may be used in the bra if you are leaking colostrum.  Do not use hot tubs, steam rooms or saunas throughout the pregnancy.  Wear your seat belt at all times when driving. This protects you and your baby if you are in an accident.  Avoid raw meat, uncooked cheese, cat litter boxes and soil used by cats. These carry germs that can cause birth defects in the baby.  The second trimester is also a good time to visit your dentist for your dental health if this has not been done yet. Getting your teeth cleaned is OK. Use a soft toothbrush. Brush gently during pregnancy.  It is easier to loose urine during pregnancy. Tightening up and strengthening the pelvic muscles will help with this problem. Practice stopping your urination while you are going to the bathroom. These are the same muscles you need to strengthen. It is also the muscles you would use as if you were trying to stop from passing gas. You can practice tightening these muscles up 10 times a set and repeating this  about 3 times per day. Once you know what muscles to tighten up, do not perform these exercises during urination. It is more likely to contribute to an infection by backing up the urine.  Ask for help if you have financial, counseling or nutritional needs during pregnancy. Your caregiver will be able to offer counseling for these needs as well as refer you for other special needs.  Your skin may become oily. If so, wash your face with mild soap, use non-greasy moisturizer and oil or cream based makeup. MEDICATIONS AND DRUG USE IN PREGNANCY  Take prenatal vitamins as directed. The vitamin should contain 1 milligram of folic acid. Keep all vitamins out of reach of children. Only a couple vitamins or tablets containing iron may be fatal to a baby or young child when ingested.  Avoid use of all medications, including herbs, over-the-counter medications, not prescribed or suggested by your caregiver. Only take over-the-counter or prescription medicines for pain, discomfort, or fever as directed by your caregiver. Do not use aspirin.  Let your caregiver also know about herbs you may be using.  Alcohol is related to a number of birth defects. This includes fetal alcohol syndrome. All alcohol, in any form,  should be avoided completely. Smoking will cause low birth rate and premature babies.  Street or illegal drugs are very harmful to the baby. They are absolutely forbidden. A baby born to an addicted mother will be addicted at birth. The baby will go through the same withdrawal an adult does. SEEK MEDICAL CARE IF:  You have any concerns or worries during your pregnancy. It is better to call with your questions if you feel they cannot wait, rather than worry about them. SEEK IMMEDIATE MEDICAL CARE IF:   An unexplained oral temperature above 102 F (38.9 C) develops, or as your caregiver suggests.  You have leaking of fluid from the vagina (birth canal). If leaking membranes are suspected, take your  temperature and tell your caregiver of this when you call.  There is vaginal spotting, bleeding, or passing clots. Tell your caregiver of the amount and how many pads are used. Light spotting in pregnancy is common, especially following intercourse.  You develop a bad smelling vaginal discharge with a change in the color from clear to white.  You continue to feel sick to your stomach (nauseated) and have no relief from remedies suggested. You vomit blood or coffee ground-like materials.  You lose more than 2 pounds of weight or gain more than 2 pounds of weight over 1 week, or as suggested by your caregiver.  You notice swelling of your face, hands, feet, or legs.  You get exposed to [place] measles and have never had them.  You are exposed to fifth disease or chickenpox.  You develop belly (abdominal) pain. Round ligament discomfort is a common non-cancerous (benign) cause of abdominal pain in pregnancy. Your caregiver still must evaluate you.  You develop a bad headache that does not go away.  You develop fever, diarrhea, pain with urination, or shortness of breath.  You develop visual problems, blurry, or double vision.  You fall or are in a car accident or any kind of trauma.  There is mental or physical violence at home. Document Released: 02/23/2001 Document Revised: 05/24/2011 Document Reviewed: 08/28/2008 [facility] Patient Information 2013 [place], [place].

## 2012-05-29 NOTE — Telephone Encounter (Signed)
Patient spoke with social worker Arline Asp, and [place] asked [place] to call patient back- patient requested prenatal vitamins prescription be sent to her pharmacy. Also wanted to know if she would get nexplanon in the hospital postpartum when she delivers- ( no, would need to be scheduled either with or after postpartum check). Called phone number and spoke with female who identified himself as husband- asked him to have her call us-no other phone number available to reach her- he said this is his work number.

## 2012-05-29 NOTE — Progress Notes (Signed)
P: 89 Pt is certain of her lmp. Has not had any ultrasounds for this pregnancy. 1hr gtt today due at 1120. Used lang line swahili.

## 2012-05-29 NOTE — Progress Notes (Signed)
This encounter was created in error - please disregard.

## 2012-05-29 NOTE — Progress Notes (Signed)
   Subjective:    Renee Zavala is a [id number] [redacted]w[redacted]d being seen today for her first obstetrical visit.  Her obstetrical history is significant for grand multiparity, language barrier (Swahili), hx chronic cariier Hepb. Patient does intend to breast feed. Pregnancy history fully reviewed.  Patient reports no complaints.  Filed Vitals:   05/29/12 1010  BP: 125/77  Temp: 98.7 F (37.1 C)  Weight: 71.804 kg (158 lb 4.8 oz)    HISTORY: OB History   Grav Para Term Preterm Abortions TAB SAB Ect Mult Living   7 6 6  0 0 0 0 0 0 6     # Outc Date GA Lbr Len/2nd Wgt Sex Del Anes PTL Lv   1 TRM 8/03    F SVD None No Yes   2 TRM 2/05    M SVD None No Yes   3 TRM 6/07    F SVD None No Yes   4 TRM 12/09    M SVD None No Yes   5 TRM 10/10    F SVD None No Yes   6 TRM 7/12 [redacted]w[redacted]d 00:00 / 00:06 3.595kg(7lb14.8oz) M SVD None  Yes   7 CUR             Largest 4000 Gm per pt, no PPH or problems with delivery Past Medical History  Diagnosis Date  . Hepatitis B surface antigen positive, currently pregnant   . Infection    Past Surgical History  Procedure Laterality Date  . No past surgeries     History reviewed. No pertinent family history.   Exam    Uterus:   S=D  Pelvic Exam:    Perineum: Normal Perineum   Vulva: normal   Vagina:  normal mucosa, normal discharge       Cervix: multiparous appearance, no lesions and friable with Pap   Adnexa: not evaluated   Bony Pelvis: proven to 4000 Gm  System: Breast:  normal appearance, no masses or tenderness   Skin: normal coloration and turgor, no rashes    Neurologic: oriented, grossly non-focal, normal mood   Extremities: normal strength, tone, and muscle mass, gait was normal for age   HEENT PERRLA   Mouth/Teeth mucous membranes moist, pharynx normal without lesions   Neck supple and no masses, no thyromegaly   Cardiovascular: regular rate and rhythm   Respiratory:  appears well, vitals normal, no respiratory distress, acyanotic, normal  RR, ear and throat exam is normal, neck free of mass or lymphadenopathy, chest clear, no wheezing, crepitations, rhonchi, normal symmetric air entry   Abdomen: S=D, NT Fh =27   Urinary: urethral meatus normal      Assessment:    Pregnancy: [id number] Patient Active Problem List  Diagnosis  . HBsAg (hepatitis B surface antigen) positive, currently pregnant  . Grand multiparity, antepartum  Insufficient PNC  Borderline anemia    Plan:     Initial labs drawn. CMP to check liver function. Hx chronic cariier Hep B; glucola today.Borderline hgb 10.3, not on PNV Prenatal vitamins.Rx given Problem list reviewed and updated.   Ultrasound discussed; fetal survey: ordered.  Follow up in 2 weeks. 50% of 30 min visit spent on counseling and coordination of care.  Language line used.   SS to see pt.   Jacai Kipp 05/29/2012

## 2012-05-31 ENCOUNTER — Encounter: Payer: Self-pay | Admitting: Obstetrics and Gynecology

## 2012-06-01 LAB — CULTURE, OB URINE

## 2012-06-02 ENCOUNTER — Ambulatory Visit (HOSPITAL_COMMUNITY)
Admission: RE | Admit: 2012-06-02 | Discharge: 2012-06-02 | Disposition: A | Discharge status: Home / Self Care | Payer: Medicaid Other | Source: Ambulatory Visit | Attending: Obstetrics and Gynecology | Admitting: Obstetrics and Gynecology

## 2012-06-02 ENCOUNTER — Other Ambulatory Visit: Payer: Self-pay | Admitting: Obstetrics and Gynecology

## 2012-06-02 ENCOUNTER — Encounter (HOSPITAL_COMMUNITY): Payer: Self-pay

## 2012-06-02 DIAGNOSIS — O0932 Supervision of pregnancy with insufficient antenatal care, second trimester: Secondary | ICD-10-CM

## 2012-06-02 DIAGNOSIS — Z1389 Encounter for screening for other disorder: Secondary | ICD-10-CM | POA: Insufficient documentation

## 2012-06-02 DIAGNOSIS — O358XX0 Maternal care for other (suspected) fetal abnormality and damage, not applicable or unspecified: Secondary | ICD-10-CM | POA: Insufficient documentation

## 2012-06-02 DIAGNOSIS — O0942 Supervision of pregnancy with grand multiparity, second trimester: Secondary | ICD-10-CM

## 2012-06-02 DIAGNOSIS — Z363 Encounter for antenatal screening for malformations: Secondary | ICD-10-CM | POA: Insufficient documentation

## 2012-06-05 NOTE — Telephone Encounter (Signed)
PNV rx has been sent to her pharmacy. Can discuss birthcontrol with patient at her next visit. Pt not returning our calls.

## 2012-06-07 ENCOUNTER — Telehealth: Payer: Self-pay | Admitting: *Deleted

## 2012-06-07 DIAGNOSIS — O2340 Unspecified infection of urinary tract in pregnancy, unspecified trimester: Secondary | ICD-10-CM

## 2012-06-07 MED ORDER — AMOXICILLIN 500 MG PO CAPS: 500.0000 mg | ORAL_CAPSULE | Freq: Three times a day (TID) | ORAL | Status: DC

## 2012-06-07 NOTE — Telephone Encounter (Addendum)
Message copied by Jill Side on Wed Jun 07, 2012  3:09 PM ------      Message from: POE, DEIRDRE C      Created: Fri Jun 02, 2012 11:57 AM       Amox 500 tid x7d ------  Called pt and left message to return our call regarding test results and important information- may state if we can leave details on her voice mail. Pt also needs to be informed of abnormal 1hr GTT and need for 3hr GTT/

## 2012-06-08 MED ORDER — AMOXICILLIN 500 MG PO CAPS: 500.0000 mg | ORAL_CAPSULE | Freq: Three times a day (TID) | ORAL | Status: DC

## 2012-06-08 NOTE — Telephone Encounter (Signed)
Called and spoke with patients husband. She is unable to come in prior to her next appointment, so I advised that she come to the 4/7 appointment fasting and we will do the 3 hr gtt that day. He also asked if I can send the Medicine to [facility] on [facility]. I stated that I would. No further questions.

## 2012-06-19 ENCOUNTER — Encounter: Payer: Self-pay | Admitting: Obstetrics & Gynecology

## 2012-06-19 ENCOUNTER — Other Ambulatory Visit: Payer: Self-pay | Admitting: Obstetrics & Gynecology

## 2012-06-19 ENCOUNTER — Ambulatory Visit (INDEPENDENT_AMBULATORY_CARE_PROVIDER_SITE_OTHER): Payer: Medicaid Other | Admitting: Obstetrics & Gynecology

## 2012-06-19 VITALS — BP 97/68 | Temp 98.1°F | Wt 160.9 lb

## 2012-06-19 DIAGNOSIS — O0943 Supervision of pregnancy with grand multiparity, third trimester: Secondary | ICD-10-CM

## 2012-06-19 DIAGNOSIS — O98519 Other viral diseases complicating pregnancy, unspecified trimester: Secondary | ICD-10-CM

## 2012-06-19 LAB — POCT URINALYSIS DIP (DEVICE)
Hgb urine dipstick: NEGATIVE
Protein, ur: 30 mg/dL — AB
Specific Gravity, Urine: >=1.03 (ref 1.005–1.030)
Urobilinogen, UA: 0.2 mg/dL (ref 0.0–1.0)

## 2012-06-19 LAB — GLUCOSE TOLERANCE, 3 HOURS: Glucose Tolerance, Fasting: 76 mg/dL (ref 70–104)

## 2012-06-19 MED ORDER — PRENATAL VITAMINS PLUS 27-1 MG PO TABS
1.0000 | ORAL_TABLET | Freq: Every day | ORAL | Status: DC
Start: 2012-06-19 — End: 2012-09-20

## 2012-06-19 NOTE — Progress Notes (Signed)
Pulse: 82 3hr gtt in process today

## 2012-06-19 NOTE — Progress Notes (Signed)
ID appointment scheduled with Dr. Luciana Axe on 06/27/12 at 230pm.

## 2012-06-19 NOTE — Addendum Note (Signed)
Addended by: Lesly Dukes on: 06/19/2012 10:41 AM   Modules accepted: Orders

## 2012-06-19 NOTE — Progress Notes (Addendum)
Nutrition note: 1st nutr visit Pt has gained 6.9# @ [redacted]w[redacted]d, which is < expected. Pt reports eating 3 meals & 2-3 snacks/d.  Pt is not taking a PNV but plans to soon.  Pt reports no N&V or heartburn. Pt received verbal & written education via language line about general nutrition during pregnancy. Disc importance of PNV. Encouraged exclusive BF for first 6 months. Disc wt gain goals of 15-25# total or 0.6#/wk (272g/wk). Pt agrees to start taking a PNV. Pt does not have WIC but plans to apply. Pt plans to BF. F/u in 2-4 wks Blondell Reveal, MS, RD, LDN

## 2012-06-19 NOTE — Patient Instructions (Signed)
Etonogestrel implant What is this medicine? ETONOGESTREL is a contraceptive (birth control) device. It is used to prevent pregnancy. It can be used for up to 3 years. This medicine may be used for other purposes; ask your health care provider or pharmacist if you have questions. What should I tell my health care provider before I take this medicine? They need to know if you have any of these conditions: -abnormal vaginal bleeding -blood vessel disease or blood clots -cancer of the breast, cervix, or liver -depression -diabetes -gallbladder disease -headaches -heart disease or recent heart attack -high blood pressure -high cholesterol -kidney disease -liver disease -renal disease -seizures -tobacco smoker -an unusual or allergic reaction to etonogestrel, other hormones, anesthetics or antiseptics, medicines, foods, dyes, or preservatives -pregnant or trying to get pregnant -breast-feeding How should I use this medicine? This device is inserted just under the skin on the inner side of your upper arm by a health care professional. Talk to your pediatrician regarding the use of this medicine in children. Special care may be needed. Overdosage: If you think you've taken too much of this medicine contact a poison control center or emergency room at once. Overdosage: If you think you have taken too much of this medicine contact a poison control center or emergency room at once. NOTE: This medicine is only for you. Do not share this medicine with others. What if I miss a dose? This does not apply. What may interact with this medicine? Do not take this medicine with any of the following medications: -amprenavir -bosentan -fosamprenavir This medicine may also interact with the following medications: -barbiturate medicines for inducing sleep or treating seizures -certain medicines for fungal infections like ketoconazole and itraconazole -griseofulvin -medicines to treat seizures like  carbamazepine, felbamate, oxcarbazepine, phenytoin, topiramate -modafinil -phenylbutazone -rifampin -some medicines to treat HIV infection like atazanavir, indinavir, lopinavir, nelfinavir, tipranavir, ritonavir -St. John's wort This list may not describe all possible interactions. Give your health care provider a list of all the medicines, herbs, non-prescription drugs, or dietary supplements you use. Also tell them if you smoke, drink alcohol, or use illegal drugs. Some items may interact with your medicine. What should I watch for while using this medicine? This product does not protect you against HIV infection (AIDS) or other sexually transmitted diseases. You should be able to feel the implant by pressing your fingertips over the skin where it was inserted. Tell your doctor if you cannot feel the implant. What side effects may I notice from receiving this medicine? Side effects that you should report to your doctor or health care professional as soon as possible: -allergic reactions like skin rash, itching or hives, swelling of the face, lips, or tongue -breast lumps -changes in vision -confusion, trouble speaking or understanding -dark urine -depressed mood -general ill feeling or flu-like symptoms -light-colored stools -loss of appetite, nausea -right upper belly pain -severe headaches -severe pain, swelling, or tenderness in the abdomen -shortness of breath, chest pain, swelling in a leg -signs of pregnancy -sudden numbness or weakness of the face, arm or leg -trouble walking, dizziness, loss of balance or coordination -unusual vaginal bleeding, discharge -unusually weak or tired -yellowing of the eyes or skin Side effects that usually do not require medical attention (Report these to your doctor or health care professional if they continue or are bothersome.): -acne -breast pain -changes in weight -cough -fever or chills -headache -irregular menstrual  bleeding -itching, burning, and vaginal discharge -pain or difficulty passing urine -sore throat This   list may not describe all possible side effects. Call your doctor for medical advice about side effects. You may report side effects to FDA at 1-800-FDA-1088. Where should I keep my medicine? This drug is given in a hospital or clinic and will not be stored at home. NOTE: This sheet is a summary. It may not cover all possible information. If you have questions about this medicine, talk to your doctor, pharmacist, or health care provider.  2013, Elsevier/Gold Standard. (11/22/2008 3:54:17 PM) Contraception Choices Contraception (birth control) is the use of any methods or devices to prevent pregnancy. Below are some methods to help avoid pregnancy. HORMONAL METHODS   Contraceptive implant. This is a thin, plastic tube containing progesterone hormone. It does not contain estrogen hormone. Your caregiver inserts the tube in the inner part of the upper arm. The tube can remain in place for up to 3 years. After 3 years, the implant must be removed. The implant prevents the ovaries from releasing an egg (ovulation), thickens the cervical mucus which prevents sperm from entering the uterus, and thins the lining of the inside of the uterus.  Progesterone-only injections. These injections are given every 3 months by your caregiver to prevent pregnancy. This synthetic progesterone hormone stops the ovaries from releasing eggs. It also thickens cervical mucus and changes the uterine lining. This makes it harder for sperm to survive in the uterus.  Birth control pills. These pills contain estrogen and progesterone hormone. They work by stopping the egg from forming in the ovary (ovulation). Birth control pills are prescribed by a caregiver.Birth control pills can also be used to treat heavy periods.  Minipill. This type of birth control pill contains only the progesterone hormone. They are taken every day of  each month and must be prescribed by your caregiver.  Birth control patch. The patch contains hormones similar to those in birth control pills. It must be changed once a week and is prescribed by a caregiver.  Vaginal ring. The ring contains hormones similar to those in birth control pills. It is left in the vagina for 3 weeks, removed for 1 week, and then a new one is put back in place. The patient must be comfortable inserting and removing the ring from the vagina.A caregiver's prescription is necessary.  Emergency contraception. Emergency contraceptives prevent pregnancy after unprotected sexual intercourse. This pill can be taken right after sex or up to 5 days after unprotected sex. It is most effective the sooner you take the pills after having sexual intercourse. Emergency contraceptive pills are available without a prescription. Check with your pharmacist. Do not use emergency contraception as your only form of birth control. BARRIER METHODS   Female condom. This is a thin sheath (latex or rubber) that is worn over the penis during sexual intercourse. It can be used with spermicide to increase effectiveness.  Female condom. This is a soft, loose-fitting sheath that is put into the vagina before sexual intercourse.  Diaphragm. This is a soft, latex, dome-shaped barrier that must be fitted by a caregiver. It is inserted into the vagina, along with a spermicidal jelly. It is inserted before intercourse. The diaphragm should be left in the vagina for 6 to 8 hours after intercourse.  Cervical cap. This is a round, soft, latex or plastic cup that fits over the cervix and must be fitted by a caregiver. The cap can be left in place for up to 48 hours after intercourse.  Sponge. This is a soft, circular piece of  polyurethane foam. The sponge has spermicide in it. It is inserted into the vagina after wetting it and before sexual intercourse.  Spermicides. These are chemicals that kill or block sperm  from entering the cervix and uterus. They come in the form of creams, jellies, suppositories, foam, or tablets. They do not require a prescription. They are inserted into the vagina with an applicator before having sexual intercourse. The process must be repeated every time you have sexual intercourse. INTRAUTERINE CONTRACEPTION  Intrauterine device (IUD). This is a T-shaped device that is put in a woman's uterus during a menstrual period to prevent pregnancy. There are 2 types:  Copper IUD. This type of IUD is wrapped in copper wire and is placed inside the uterus. Copper makes the uterus and fallopian tubes produce a fluid that kills sperm. It can stay in place for 10 years.  Hormone IUD. This type of IUD contains the hormone progestin (synthetic progesterone). The hormone thickens the cervical mucus and prevents sperm from entering the uterus, and it also thins the uterine lining to prevent implantation of a fertilized egg. The hormone can weaken or kill the sperm that get into the uterus. It can stay in place for 5 years. PERMANENT METHODS OF CONTRACEPTION  Female tubal ligation. This is when the woman's fallopian tubes are surgically sealed, tied, or blocked to prevent the egg from traveling to the uterus.  Female sterilization. This is when the female has the tubes that carry sperm tied off (vasectomy).This blocks sperm from entering the vagina during sexual intercourse. After the procedure, the man can still ejaculate fluid (semen). NATURAL PLANNING METHODS  Natural family planning. This is not having sexual intercourse or using a barrier method (condom, diaphragm, cervical cap) on days the woman could become pregnant.  Calendar method. This is keeping track of the length of each menstrual cycle and identifying when you are fertile.  Ovulation method. This is avoiding sexual intercourse during ovulation.  Symptothermal method. This is avoiding sexual intercourse during ovulation, using a  thermometer and ovulation symptoms.  Post-ovulation method. This is timing sexual intercourse after you have ovulated. Regardless of which type or method of contraception you choose, it is important that you use condoms to protect against the transmission of sexually transmitted diseases (STDs). Talk with your caregiver about which form of contraception is most appropriate for you. Document Released: 03/01/2005 Document Revised: 05/24/2011 Document Reviewed: 07/08/2010 [facility] Patient Information 2013 [place], [place].

## 2012-06-19 NOTE — Progress Notes (Signed)
Hepatitis B positive during pregnancy, already in last trimester: I will check Hep B Sag, (hep panel), Hepatitis B DNA, hep B e Ag, PT/INR (CMP, CBC in EPIC and nml). There are various opinions re threshold to start ARV in pregnant lady in last trimester.

## 2012-06-19 NOTE — Progress Notes (Deleted)
Hepatitis B positive during pregnancy, already in last trimester: I will check Hep B Sag, (hep panel), Hepatitis B DNA, hep B e Ag, anti Hep B E ag, PT/INR CMP, CBC. There are various opinions re threshold to start ARV in pregnant lady in last trimester. The authors of UptoDate , Dyke Brackett al, for ex recommend ARV prophylaxis for pts with prior child with Hep B and Mom with Hep B DNA > 200k In Mom with prior child NOT born with Hep B they use cutoff of >14million units per ml. Will send to ID for final decision on treatment.

## 2012-06-19 NOTE — Progress Notes (Signed)
Swahili interpreter [phone] Remi Deter used to advise patient of ID appt.

## 2012-06-20 ENCOUNTER — Encounter: Payer: Self-pay | Admitting: *Deleted

## 2012-06-20 LAB — HEPATITIS C VRS RNA DETECT BY PCR-QUAL: Hepatitis C Vrs RNA by PCR-Qual: NEGATIVE

## 2012-06-27 ENCOUNTER — Ambulatory Visit (INDEPENDENT_AMBULATORY_CARE_PROVIDER_SITE_OTHER): Payer: Medicaid Other | Admitting: Internal Medicine

## 2012-06-27 ENCOUNTER — Encounter: Payer: Self-pay | Admitting: Internal Medicine

## 2012-06-27 ENCOUNTER — Encounter: Payer: Self-pay | Admitting: *Deleted

## 2012-06-27 VITALS — BP 116/75 | HR 90 | Temp 98.1°F | Wt 160.0 lb

## 2012-06-27 DIAGNOSIS — B181 Chronic viral hepatitis B without delta-agent: Secondary | ICD-10-CM

## 2012-06-27 DIAGNOSIS — O26899 Other specified pregnancy related conditions, unspecified trimester: Secondary | ICD-10-CM

## 2012-06-27 NOTE — Assessment & Plan Note (Signed)
She is hepatitis B carrier and there is risk of transmission to her baby. She however has a negative DNA and therefore no indication for antiviral treatment for her.  I did discuss with her the findings of her labs and that she does have hepatitis B. I did discuss that her baby will need to get the hepatitis B immunoglobulin as well as the hepatitis B vaccine. She will be able to breast feed.  Her husband did have many questions and was very concerned that we were not going to treat her with medication at this time. I did explain to them again that there is no indication for treatment and side effects of medication for her and potential side effects with the baby, though known to be well tolerated and good history of use in pregnancy, does not outweigh the risk of therapy since the goal of therapy is to get the DNA level down to zero. I will have her return in one month to double check the DNA to assure it is negative. After that I did tell her that we can follow her in the future for routine monitoring of her hepatitis B but again without active virus or be no indication for treatment.

## 2012-06-27 NOTE — Progress Notes (Signed)
  Subjective:    Patient ID: Renee Zavala, female    DOB: 1981/02/25, 32 y.o.   MRN: [id number]  HPI New patient for evaluation for chronic hepatitis B. She is [redacted] weeks pregnant at this time with her seventh child. In her routine prenatal exam, she was noted to have a surface antigen positive, with a hepatitis B DNA undetectable and hepatitis be he antigen negative. She does not recall any history of hepatitis B and does not recall any history of being tested with her previous pregnancies. The husband, who is speaking for her, is unsure if she has ever had a blood transfusion and does not know much about her medical history including not knowing anything about her parents. She has no problems with jaundice, liver issues and has no other medical problems and takes no medication outside of prenatal vitamins.   Review of Systems  Constitutional: Negative for unexpected weight change.  Gastrointestinal: Negative for abdominal pain.  Genitourinary: Negative for flank pain.  Neurological: Negative for dizziness and headaches.       Objective:   Physical Exam  Constitutional: She appears well-developed and well-nourished. No distress.  Cardiovascular: Normal rate, regular rhythm and normal heart sounds.  Exam reveals no gallop and no friction rub.   No murmur heard. Pulmonary/Chest: Effort normal and breath sounds normal. No respiratory distress. She has no wheezes. She has no rales.  Abdominal:  gravid          Assessment & Plan:

## 2012-07-03 ENCOUNTER — Other Ambulatory Visit: Payer: Self-pay | Admitting: Obstetrics and Gynecology

## 2012-07-03 ENCOUNTER — Ambulatory Visit (INDEPENDENT_AMBULATORY_CARE_PROVIDER_SITE_OTHER): Payer: Medicaid Other | Admitting: Obstetrics and Gynecology

## 2012-07-03 VITALS — BP 98/66 | Temp 97.4°F | Wt 160.0 lb

## 2012-07-03 DIAGNOSIS — B181 Chronic viral hepatitis B without delta-agent: Secondary | ICD-10-CM

## 2012-07-03 DIAGNOSIS — O9989 Other specified diseases and conditions complicating pregnancy, childbirth and the puerperium: Secondary | ICD-10-CM

## 2012-07-03 DIAGNOSIS — O0930 Supervision of pregnancy with insufficient antenatal care, unspecified trimester: Secondary | ICD-10-CM

## 2012-07-03 LAB — POCT URINALYSIS DIP (DEVICE)
Bilirubin Urine: NEGATIVE
Glucose, UA: NEGATIVE mg/dL
Hgb urine dipstick: NEGATIVE
Ketones, ur: NEGATIVE mg/dL
Specific Gravity, Urine: 1.02 (ref 1.005–1.030)

## 2012-07-03 NOTE — Progress Notes (Signed)
Swahili interpreter used. Urine dip pending. No concerns. Taking PNV. Reviewed Dr. Ephriam Knuckles notes on being chronic carrier Hep B and understands POC. 3 hr OGTT well WNL. [place] all normal.

## 2012-07-03 NOTE — Patient Instructions (Signed)
Pregnancy - Third Trimester  The third trimester of pregnancy (the last 3 months) is a period of the most rapid growth for you and your baby. The baby approaches a length of 20 inches and a weight of 6 to 10 pounds. The baby is adding on fat and getting ready for life outside your body. While inside, babies have periods of sleeping and waking, suck their thumbs, and hiccups. You can often feel small contractions of the uterus. This is false labor. It is also called Braxton-Hicks contractions. This is like a practice for labor. The usual problems in this stage of pregnancy include more difficulty breathing, swelling of the hands and feet from water retention, and having to urinate more often because of the uterus and baby pressing on your bladder.   PRENATAL EXAMS  · Blood work may continue to be done during prenatal exams. These tests are done to check on your health and the probable health of your baby. Blood work is used to follow your blood levels (hemoglobin). Anemia (low hemoglobin) is common during pregnancy. Iron and vitamins are given to help prevent this. You may also continue to be checked for diabetes. Some of the past blood tests may be done again.  · The size of the uterus is measured during each visit. This makes sure your baby is growing properly according to your pregnancy dates.  · Your blood pressure is checked every prenatal visit. This is to make sure you are not getting toxemia.  · Your urine is checked every prenatal visit for infection, diabetes and protein.  · Your weight is checked at each visit. This is done to make sure gains are happening at the suggested rate and that you and your baby are growing normally.  · Sometimes, an ultrasound is performed to confirm the position and the proper growth and development of the baby. This is a test done that bounces harmless sound waves off the baby so your caregiver can more accurately determine due dates.  · Discuss the type of pain medication and  anesthesia you will have during your labor and delivery.  · Discuss the possibility and anesthesia if a Cesarean Section might be necessary.  · Inform your caregiver if there is any mental or physical violence at home.  Sometimes, a specialized non-stress test, contraction stress test and biophysical profile are done to make sure the baby is not having a problem. Checking the amniotic fluid surrounding the baby is called an amniocentesis. The amniotic fluid is removed by sticking a needle into the belly (abdomen). This is sometimes done near the end of pregnancy if an early delivery is required. In this case, it is done to help make sure the baby's lungs are mature enough for the baby to live outside of the womb. If the lungs are not mature and it is unsafe to deliver the baby, an injection of cortisone medication is given to the mother 1 to 2 days before the delivery. This helps the baby's lungs mature and makes it safer to deliver the baby.  CHANGES OCCURING IN THE THIRD TRIMESTER OF PREGNANCY  Your body goes through many changes during pregnancy. They vary from person to person. Talk to your caregiver about changes you notice and are concerned about.  · During the last trimester, you have probably had an increase in your appetite. It is normal to have cravings for certain foods. This varies from person to person and pregnancy to pregnancy.  · You may begin to   get stretch marks on your hips, abdomen, and breasts. These are normal changes in the body during pregnancy. There are no exercises or medications to take which prevent this change.  · Constipation may be treated with a stool softener or adding bulk to your diet. Drinking lots of fluids, fiber in vegetables, fruits, and whole grains are helpful.  · Exercising is also helpful. If you have been very active up until your pregnancy, most of these activities can be continued during your pregnancy. If you have been less active, it is helpful to start an exercise  program such as walking. Consult your caregiver before starting exercise programs.  · Avoid all smoking, alcohol, un-prescribed drugs, herbs and "street drugs" during your pregnancy. These chemicals affect the formation and growth of the baby. Avoid chemicals throughout the pregnancy to ensure the delivery of a healthy infant.  · Backache, varicose veins and hemorrhoids may develop or get worse.  · You will tire more easily in the third trimester, which is normal.  · The baby's movements may be stronger and more often.  · You may become short of breath easily.  · Your belly button may stick out.  · A yellow discharge may leak from your breasts called colostrum.  · You may have a bloody mucus discharge. This usually occurs a few days to a week before labor begins.  HOME CARE INSTRUCTIONS   · Keep your caregiver's appointments. Follow your caregiver's instructions regarding medication use, exercise, and diet.  · During pregnancy, you are providing food for you and your baby. Continue to eat regular, well-balanced meals. Choose foods such as meat, fish, milk and other low fat dairy products, vegetables, fruits, and whole-grain breads and cereals. Your caregiver will tell you of the ideal weight gain.  · A physical sexual relationship may be continued throughout pregnancy if there are no other problems such as early (premature) leaking of amniotic fluid from the membranes, vaginal bleeding, or belly (abdominal) pain.  · Exercise regularly if there are no restrictions. Check with your caregiver if you are unsure of the safety of your exercises. Greater weight gain will occur in the last 2 trimesters of pregnancy. Exercising helps:  · Control your weight.  · Get you in shape for labor and delivery.  · You lose weight after you deliver.  · Rest a lot with legs elevated, or as needed for leg cramps or low back pain.  · Wear a good support or jogging bra for breast tenderness during pregnancy. This may help if worn during  sleep. Pads or tissues may be used in the bra if you are leaking colostrum.  · Do not use hot tubs, steam rooms, or saunas.  · Wear your seat belt when driving. This protects you and your baby if you are in an accident.  · Avoid raw meat, cat litter boxes and soil used by cats. These carry germs that can cause birth defects in the baby.  · It is easier to loose urine during pregnancy. Tightening up and strengthening the pelvic muscles will help with this problem. You can practice stopping your urination while you are going to the bathroom. These are the same muscles you need to strengthen. It is also the muscles you would use if you were trying to stop from passing gas. You can practice tightening these muscles up 10 times a set and repeating this about 3 times per day. Once you know what muscles to tighten up, do not perform these   exercises during urination. It is more likely to cause an infection by backing up the urine.  · Ask for help if you have financial, counseling or nutritional needs during pregnancy. Your caregiver will be able to offer counseling for these needs as well as refer you for other special needs.  · Make a list of emergency phone numbers and have them available.  · Plan on getting help from family or friends when you go home from the hospital.  · Make a trial run to the hospital.  · Take prenatal classes with the father to understand, practice and ask questions about the labor and delivery.  · Prepare the baby's room/nursery.  · Do not travel out of the city unless it is absolutely necessary and with the advice of your caregiver.  · Wear only low or no heal shoes to have better balance and prevent falling.  MEDICATIONS AND DRUG USE IN PREGNANCY  · Take prenatal vitamins as directed. The vitamin should contain 1 milligram of folic acid. Keep all vitamins out of reach of children. Only a couple vitamins or tablets containing iron may be fatal to a baby or young child when ingested.  · Avoid use  of all medications, including herbs, over-the-counter medications, not prescribed or suggested by your caregiver. Only take over-the-counter or prescription medicines for pain, discomfort, or fever as directed by your caregiver. Do not use aspirin, ibuprofen (Motrin®, Advil®, Nuprin®) or naproxen (Aleve®) unless OK'd by your caregiver.  · Let your caregiver also know about herbs you may be using.  · Alcohol is related to a number of birth defects. This includes fetal alcohol syndrome. All alcohol, in any form, should be avoided completely. Smoking will cause low birth rate and premature babies.  · Street/illegal drugs are very harmful to the baby. They are absolutely forbidden. A baby born to an addicted mother will be addicted at birth. The baby will go through the same withdrawal an adult does.  SEEK MEDICAL CARE IF:  You have any concerns or worries during your pregnancy. It is better to call with your questions if you feel they cannot wait, rather than worry about them.  DECISIONS ABOUT CIRCUMCISION  You may or may not know the sex of your baby. If you know your baby is a boy, it may be time to think about circumcision. Circumcision is the removal of the foreskin of the penis. This is the skin that covers the sensitive end of the penis. There is no proven medical need for this. Often this decision is made on what is popular at the time or based upon religious beliefs and social issues. You can discuss these issues with your caregiver or pediatrician.  SEEK IMMEDIATE MEDICAL CARE IF:   · An unexplained oral temperature above 102° F (38.9° C) develops, or as your caregiver suggests.  · You have leaking of fluid from the vagina (birth canal). If leaking membranes are suspected, take your temperature and tell your caregiver of this when you call.  · There is vaginal spotting, bleeding or passing clots. Tell your caregiver of the amount and how many pads are used.  · You develop a bad smelling vaginal discharge with  a change in the color from clear to white.  · You develop vomiting that lasts more than 24 hours.  · You develop chills or fever.  · You develop shortness of breath.  · You develop burning on urination.  · You loose more than 2 pounds of weight   or gain more than 2 pounds of weight or as suggested by your caregiver.  · You notice sudden swelling of your face, hands, and feet or legs.  · You develop belly (abdominal) pain. Round ligament discomfort is a common non-cancerous (benign) cause of abdominal pain in pregnancy. Your caregiver still must evaluate you.  · You develop a severe headache that does not go away.  · You develop visual problems, blurred or double vision.  · If you have not felt your baby move for more than 1 hour. If you think the baby is not moving as much as usual, eat something with sugar in it and lie down on your left side for an hour. The baby should move at least 4 to 5 times per hour. Call right away if your baby moves less than that.  · You fall, are in a car accident or any kind of trauma.  · There is mental or physical violence at home.  Document Released: 02/23/2001 Document Revised: 05/24/2011 Document Reviewed: 08/28/2008  ExitCare® Patient Information ©2013 [employer].

## 2012-07-03 NOTE — Progress Notes (Signed)
Pulse 76  [employer] interpreter used # [id number]

## 2012-07-17 ENCOUNTER — Ambulatory Visit (INDEPENDENT_AMBULATORY_CARE_PROVIDER_SITE_OTHER): Payer: Medicaid Other | Admitting: Obstetrics and Gynecology

## 2012-07-17 ENCOUNTER — Encounter: Payer: Self-pay | Admitting: Obstetrics and Gynecology

## 2012-07-17 ENCOUNTER — Other Ambulatory Visit: Payer: Self-pay | Admitting: Obstetrics and Gynecology

## 2012-07-17 VITALS — BP 100/68 | Temp 97.6°F | Wt 164.7 lb

## 2012-07-17 DIAGNOSIS — B181 Chronic viral hepatitis B without delta-agent: Secondary | ICD-10-CM

## 2012-07-17 DIAGNOSIS — O9989 Other specified diseases and conditions complicating pregnancy, childbirth and the puerperium: Secondary | ICD-10-CM

## 2012-07-17 DIAGNOSIS — O0930 Supervision of pregnancy with insufficient antenatal care, unspecified trimester: Secondary | ICD-10-CM

## 2012-07-17 DIAGNOSIS — O0943 Supervision of pregnancy with grand multiparity, third trimester: Secondary | ICD-10-CM

## 2012-07-17 DIAGNOSIS — O0933 Supervision of pregnancy with insufficient antenatal care, third trimester: Secondary | ICD-10-CM

## 2012-07-17 LAB — POCT URINALYSIS DIP (DEVICE)
Bilirubin Urine: NEGATIVE
Ketones, ur: NEGATIVE mg/dL
pH: 6.5 (ref 5.0–8.0)

## 2012-07-17 NOTE — Progress Notes (Signed)
HR= 78

## 2012-07-17 NOTE — Progress Notes (Signed)
Patient doing well without complaints. FM/PTL precautions reviewed.

## 2012-08-02 ENCOUNTER — Ambulatory Visit (INDEPENDENT_AMBULATORY_CARE_PROVIDER_SITE_OTHER): Payer: Medicaid Other | Admitting: Advanced Practice Midwife

## 2012-08-02 VITALS — BP 117/75 | Temp 98.0°F | Wt 162.8 lb

## 2012-08-02 DIAGNOSIS — O9989 Other specified diseases and conditions complicating pregnancy, childbirth and the puerperium: Secondary | ICD-10-CM

## 2012-08-02 DIAGNOSIS — B181 Chronic viral hepatitis B without delta-agent: Secondary | ICD-10-CM

## 2012-08-02 LAB — POCT URINALYSIS DIP (DEVICE)
Glucose, UA: NEGATIVE mg/dL
Ketones, ur: NEGATIVE mg/dL
Specific Gravity, Urine: 1.02 (ref 1.005–1.030)
Urobilinogen, UA: 0.2 mg/dL (ref 0.0–1.0)

## 2012-08-02 NOTE — Progress Notes (Signed)
Pulse: 78 Used language line swahili

## 2012-08-02 NOTE — Patient Instructions (Addendum)
Vaginal Delivery Your caregiver must first be sure you are in labor. Signs of labor include:  You may pass what is called "the mucus plug" before labor begins. This is a small amount of blood stained mucus.  Regular uterine contractions.  The time between contractions get closer together.  The discomfort and pain gradually gets more intense.  Pains are mostly located in the back.  Pains get worse when walking.  The cervix (the opening of the uterus) becomes thinner (begins to efface) and opens up (dilates). Once you are in labor and admitted into the hospital or care center, your caregiver will do the following:  A complete physical examination.  Check your vital signs (blood pressure, pulse, temperature and the fetal heart rate).  Do a vaginal examination (using a sterile glove and lubricant) to determine:  The position (presentation) of the baby (head [vertex] or buttock first).  The level (station) of the baby's head in the birth canal.  The effacement and dilatation of the cervix.  An electronic monitor is usually placed on your abdomen. The monitor follows the length and intensity of the contractions, as well as the baby's heart rate.  Usually, your caregiver will insert an IV in your arm with a bottle of sugar water. This is done as a precaution so that medications can be given to you quickly during labor or delivery. NORMAL LABOR AND DELIVERY IS DIVIDED UP INTO 3 STAGES: First Stage This is when regular contractions begin and the cervix begins to efface and dilate. This stage can last from 3 to 15 hours. The end of the first stage is when the cervix is 100% effaced and 10 centimeters dilated. Pain medications may be given by   Injection (morphine, demerol, etc.)  Regional anesthesia (spinal, caudal or epidural, anesthetics given in different locations of the spine). Paracervical pain medication may be given, which is an injection of and anesthetic on each side of the  cervix. A pregnant woman may request to have "Natural Childbirth" which is not to have any medications or anesthesia during her labor and delivery. Second Stage This is when the baby comes down through the birth canal (vagina) and is born. This can take 1 to 4 hours. As the baby's head comes down through the birth canal, you may feel like you are going to have a bowel movement. You will get the urge to bear down and push until the baby is delivered. As the baby's head is being delivered, the caregiver will decide if an episiotomy (a cut in the perineum and vagina area) is needed to prevent tearing of the tissue in this area. The episiotomy is sewn up after the delivery of the baby and placenta. Sometimes a mask with nitrous oxide is given for the mother to breath during the delivery of the baby to help if there is too much pain. The end of Stage 2 is when the baby is fully delivered. Then when the umbilical cord stops pulsating it is clamped and cut. Third Stage The third stage begins after the baby is completely delivered and ends after the placenta (afterbirth) is delivered. This usually takes 5 to 30 minutes. After the placenta is delivered, a medication is given either by intravenous or injection to help contract the uterus and prevent bleeding. The third stage is not painful and pain medication is usually not necessary. If an episiotomy was done, it is repaired at this time. After the delivery, the mother is watched and monitored closely for  1 to 2 hours to make sure there is no postpartum bleeding (hemorrhage). If there is a lot of bleeding, medication is given to contract the uterus and stop the bleeding. Document Released: 12/09/2007 Document Revised: 11/24/2011 Document Reviewed: 12/09/2007 [facility] Patient Information 2014 [place], [place].

## 2012-08-02 NOTE — Progress Notes (Signed)
Language line used. No complaints. Reviewed signs of labor, ROM, dec. FM.

## 2012-08-03 ENCOUNTER — Ambulatory Visit: Payer: Medicaid Other | Admitting: Internal Medicine

## 2012-08-04 ENCOUNTER — Other Ambulatory Visit: Payer: Self-pay | Admitting: Advanced Practice Midwife

## 2012-08-04 DIAGNOSIS — O2343 Unspecified infection of urinary tract in pregnancy, third trimester: Secondary | ICD-10-CM

## 2012-08-04 DIAGNOSIS — O2340 Unspecified infection of urinary tract in pregnancy, unspecified trimester: Secondary | ICD-10-CM | POA: Insufficient documentation

## 2012-08-04 MED ORDER — CEPHALEXIN 500 MG PO CAPS: 500.0000 mg | ORAL_CAPSULE | Freq: Four times a day (QID) | ORAL | Status: DC

## 2012-08-05 LAB — CULTURE, OB URINE

## 2012-08-09 ENCOUNTER — Ambulatory Visit (INDEPENDENT_AMBULATORY_CARE_PROVIDER_SITE_OTHER): Payer: Medicaid Other | Admitting: Advanced Practice Midwife

## 2012-08-09 VITALS — BP 100/72 | Temp 98.4°F | Wt 163.0 lb

## 2012-08-09 DIAGNOSIS — O0930 Supervision of pregnancy with insufficient antenatal care, unspecified trimester: Secondary | ICD-10-CM

## 2012-08-09 DIAGNOSIS — Z3493 Encounter for supervision of normal pregnancy, unspecified, third trimester: Secondary | ICD-10-CM

## 2012-08-09 LAB — POCT URINALYSIS DIP (DEVICE)
Glucose, UA: NEGATIVE mg/dL
Hgb urine dipstick: NEGATIVE
Specific Gravity, Urine: >=1.03 (ref 1.005–1.030)
Urobilinogen, UA: 0.2 mg/dL (ref 0.0–1.0)
pH: 6.5 (ref 5.0–8.0)

## 2012-08-09 NOTE — Progress Notes (Signed)
Language line used. Well, no c/o. Rev'd precautions. Informed of Keflex for UTI at pharmacy. GBS and GC collected. Noticed very infected appearing ingrown toenail during exam - pt states it's been bothering her for a while. Keflex may help, but recommended f/u at urgent care.

## 2012-08-09 NOTE — Patient Instructions (Signed)
Pregnancy - Third Trimester  The third trimester begins at the 28th week of pregnancy and ends at birth. It is important to follow your doctor's instructions.  HOME CARE    Go to your doctor's visits.   Do not smoke.   Do not drink alcohol or use drugs.   Only take medicine as told by your doctor.   Take prenatal vitamins as told. The vitamin should contain 1 milligram of folic acid.   Exercise.   Eat healthy foods. Eat regular, well-balanced meals.   You can have sex (intercourse) if there are no other problems with the pregnancy.   Do not use hot tubs, steam rooms, or saunas.   Wear a seat belt while driving.   Avoid raw meat, uncooked cheese, and litter boxes and soil used by cats.   Rest with your legs raised (elevated).   Make a list of emergency phone numbers. Keep this list with you.   Arrange for help when you come back home after delivering the baby.   Make a trial run to the hospital.   Take prenatal classes.   Prepare the baby's nursery.   Do not travel out of the city. If you absolutely have to, get permission from your doctor first.   Wear flat shoes. Do not wear high heels.  GET HELP RIGHT AWAY IF:    You have a temperature by mouth above 102 F (38.9 C), not controlled by medicine.   You have not felt the baby move for more than 1 hour. If you think the baby is not moving as much as normal, eat something with sugar in it or lie down on your left side for an hour. The baby should move at least 4 to 5 times per hour.   Fluid is coming from the vagina.   Blood is coming from the vagina. Light spotting is common, especially after sex (intercourse).   You have belly (abdominal) pain.   You have a bad smelling fluid (discharge) coming from the vagina. The fluid changes from clear to white.   You still feel sick to your stomach (nauseous).   You throw up (vomit) for more than 24 hours.   You have the chills.   You have shortness of breath.   You have a burning feeling when you  pee (urinate).   You lose or gain more than 2 pounds (0.9 kilograms) of weight over a week, or as told by your doctor.   Your face, hands, feet, or legs get puffy (swell).   You have a bad headache that will not go away.   You start to have problems seeing (blurry or double vision).   You fall, are in a car accident, or have any kind of trauma.   There is mental or physical violence at home.   You have any concerns or worries during your pregnancy.  MAKE SURE YOU:    Understand these instructions.   Will watch your condition.   Will get help right away if you are not doing well or get worse.  Document Released: 05/26/2009 Document Revised: 05/24/2011 Document Reviewed: 05/26/2009  Exit[employer] Patient Information 2014 [employer].

## 2012-08-09 NOTE — Progress Notes (Signed)
Pulse: 76

## 2012-08-16 ENCOUNTER — Ambulatory Visit: Payer: Medicaid Other | Admitting: Internal Medicine

## 2012-08-16 ENCOUNTER — Ambulatory Visit (INDEPENDENT_AMBULATORY_CARE_PROVIDER_SITE_OTHER): Payer: Medicaid Other | Admitting: Family

## 2012-08-16 ENCOUNTER — Telehealth: Payer: Self-pay | Admitting: *Deleted

## 2012-08-16 VITALS — BP 110/77 | Temp 98.7°F | Wt 167.2 lb

## 2012-08-16 DIAGNOSIS — O9989 Other specified diseases and conditions complicating pregnancy, childbirth and the puerperium: Secondary | ICD-10-CM

## 2012-08-16 DIAGNOSIS — B181 Chronic viral hepatitis B without delta-agent: Secondary | ICD-10-CM

## 2012-08-16 LAB — POCT URINALYSIS DIP (DEVICE)
Bilirubin Urine: NEGATIVE
Hgb urine dipstick: NEGATIVE
Nitrite: NEGATIVE
pH: 7 (ref 5.0–8.0)

## 2012-08-16 MED ORDER — CEPHALEXIN 500 MG PO CAPS: 500.0000 mg | ORAL_CAPSULE | Freq: Three times a day (TID) | ORAL | Status: AC

## 2012-08-16 NOTE — Progress Notes (Signed)
Went to pharmacy for antibiotic for UTI, but it was not ready; RX today for Keflex.  No concerns; reviewed GBS results.

## 2012-08-16 NOTE — Progress Notes (Signed)
Pulse: 89

## 2012-08-16 NOTE — Telephone Encounter (Signed)
Pt missed today's appointment.  Used interpreter to reschedule appointment with her husband for next week. Andree Coss, RN

## 2012-08-22 ENCOUNTER — Inpatient Hospital Stay (HOSPITAL_COMMUNITY)
Admission: AD | Admit: 2012-08-22 | Discharge: 2012-08-24 | DRG: 775 | Disposition: A | Discharge status: Home / Self Care | Payer: Medicaid Other | Source: Ambulatory Visit | Attending: Obstetrics & Gynecology | Admitting: Obstetrics & Gynecology

## 2012-08-22 ENCOUNTER — Encounter (HOSPITAL_COMMUNITY): Payer: Self-pay | Admitting: Obstetrics and Gynecology

## 2012-08-22 DIAGNOSIS — B1910 Unspecified viral hepatitis B without hepatic coma: Secondary | ICD-10-CM | POA: Diagnosis present

## 2012-08-22 DIAGNOSIS — O0940 Supervision of pregnancy with grand multiparity, unspecified trimester: Secondary | ICD-10-CM

## 2012-08-22 DIAGNOSIS — O26619 Liver and biliary tract disorders in pregnancy, unspecified trimester: Secondary | ICD-10-CM

## 2012-08-22 LAB — TYPE AND SCREEN
ABO/RH(D): A POS
Antibody Screen: NEGATIVE

## 2012-08-22 LAB — CBC
HCT: 33.9 % — ABNORMAL LOW (ref 36.0–46.0)
Hemoglobin: 11.2 g/dL — ABNORMAL LOW (ref 12.0–15.0)
RDW: 14.2 % (ref 11.5–15.5)
WBC: 9.1 10*3/uL (ref 4.0–10.5)

## 2012-08-22 LAB — OB RESULTS CONSOLE GBS: GBS: NEGATIVE

## 2012-08-22 MED ORDER — ONDANSETRON HCL 4 MG PO TABS: 4.0000 mg | ORAL_TABLET | ORAL | Status: DC | PRN

## 2012-08-22 MED ORDER — BENZOCAINE-MENTHOL 20-0.5 % EX AERO: 1.0000 "application " | INHALATION_SPRAY | CUTANEOUS | Status: DC | PRN

## 2012-08-22 MED ORDER — PRENATAL MULTIVITAMIN CH
1.0000 | ORAL_TABLET | Freq: Every day | ORAL | Status: DC
  Administered 2012-08-23 – 2012-08-24 (×2): 1 via ORAL
  Filled 2012-08-22 (×2): qty 1

## 2012-08-22 MED ORDER — ONDANSETRON HCL 4 MG/2ML IJ SOLN: 4.0000 mg | Freq: Four times a day (QID) | INTRAMUSCULAR | Status: DC | PRN

## 2012-08-22 MED ORDER — IBUPROFEN 600 MG PO TABS: 600.0000 mg | ORAL_TABLET | Freq: Four times a day (QID) | ORAL | Status: DC | PRN

## 2012-08-22 MED ORDER — OXYCODONE-ACETAMINOPHEN 5-325 MG PO TABS: 1.0000 | ORAL_TABLET | ORAL | Status: DC | PRN

## 2012-08-22 MED ORDER — OXYTOCIN 40 UNITS IN LACTATED RINGERS INFUSION - SIMPLE MED: 62.5000 mL/h | INTRAVENOUS | Status: DC

## 2012-08-22 MED ORDER — LACTATED RINGERS IV SOLN: 500.0000 mL | INTRAVENOUS | Status: DC | PRN

## 2012-08-22 MED ORDER — CITRIC ACID-SODIUM CITRATE 334-500 MG/5ML PO SOLN: 30.0000 mL | ORAL | Status: DC | PRN

## 2012-08-22 MED ORDER — OXYTOCIN BOLUS FROM INFUSION: 500.0000 mL | INTRAVENOUS | Status: DC

## 2012-08-22 MED ORDER — LACTATED RINGERS IV SOLN: INTRAVENOUS | Status: DC

## 2012-08-22 MED ORDER — OXYTOCIN 40 UNITS IN LACTATED RINGERS INFUSION - SIMPLE MED
INTRAVENOUS | Status: AC
  Filled 2012-08-22: qty 1000

## 2012-08-22 MED ORDER — LIDOCAINE HCL (PF) 1 % IJ SOLN
INTRAMUSCULAR | Status: AC
  Filled 2012-08-22: qty 30

## 2012-08-22 MED ORDER — TETANUS-DIPHTH-ACELL PERTUSSIS 5-2.5-18.5 LF-MCG/0.5 IM SUSP
0.5000 mL | Freq: Once | INTRAMUSCULAR | Status: AC
  Administered 2012-08-23: 0.5 mL via INTRAMUSCULAR
  Filled 2012-08-22: qty 0.5

## 2012-08-22 MED ORDER — NALBUPHINE SYRINGE 5 MG/0.5 ML
5.0000 mg | INJECTION | INTRAMUSCULAR | Status: DC | PRN
  Filled 2012-08-22: qty 0.5

## 2012-08-22 MED ORDER — DIPHENHYDRAMINE HCL 25 MG PO CAPS: 25.0000 mg | ORAL_CAPSULE | Freq: Four times a day (QID) | ORAL | Status: DC | PRN

## 2012-08-22 MED ORDER — SENNOSIDES-DOCUSATE SODIUM 8.6-50 MG PO TABS
2.0000 | ORAL_TABLET | Freq: Every day | ORAL | Status: DC
  Administered 2012-08-22 – 2012-08-23 (×2): 2 via ORAL

## 2012-08-22 MED ORDER — ACETAMINOPHEN 325 MG PO TABS: 650.0000 mg | ORAL_TABLET | ORAL | Status: DC | PRN

## 2012-08-22 MED ORDER — LANOLIN HYDROUS EX OINT: TOPICAL_OINTMENT | CUTANEOUS | Status: DC | PRN

## 2012-08-22 MED ORDER — SIMETHICONE 80 MG PO CHEW: 80.0000 mg | CHEWABLE_TABLET | ORAL | Status: DC | PRN

## 2012-08-22 MED ORDER — WITCH HAZEL-GLYCERIN EX PADS: 1.0000 "application " | MEDICATED_PAD | CUTANEOUS | Status: DC | PRN

## 2012-08-22 MED ORDER — DIBUCAINE 1 % RE OINT: 1.0000 "application " | TOPICAL_OINTMENT | RECTAL | Status: DC | PRN

## 2012-08-22 MED ORDER — ONDANSETRON HCL 4 MG/2ML IJ SOLN: 4.0000 mg | INTRAMUSCULAR | Status: DC | PRN

## 2012-08-22 MED ORDER — LIDOCAINE HCL (PF) 1 % IJ SOLN
30.0000 mL | INTRAMUSCULAR | Status: DC | PRN
  Filled 2012-08-22: qty 30

## 2012-08-22 MED ORDER — IBUPROFEN 600 MG PO TABS
600.0000 mg | ORAL_TABLET | Freq: Four times a day (QID) | ORAL | Status: DC
  Administered 2012-08-22 – 2012-08-24 (×7): 600 mg via ORAL
  Filled 2012-08-22 (×7): qty 1

## 2012-08-22 MED ORDER — ZOLPIDEM TARTRATE 5 MG PO TABS: 5.0000 mg | ORAL_TABLET | Freq: Every evening | ORAL | Status: DC | PRN

## 2012-08-22 NOTE — H&P (Signed)
Brentley Horrell is a 32 y.o. G7P7007 at [redacted]w[redacted]d who presents with contractions.  Patient speak's Swahili and no interpreter was readily available.  Patient accompanied by family member who speaks limited [place].  Family member reported patient was experiencing regular, intense contractions.  No reports of loss of fluid (however, this was significantly limited due to language barrier).    History OB History   Grav Para Term Preterm Abortions TAB SAB Ect Mult Living   7 6 6  0 0 0 0 0 0 6     Past Medical History  Diagnosis Date  . Hepatitis B surface antigen positive, currently pregnant   . Infection    Past Surgical History  Procedure Laterality Date  . No past surgeries     Family History: family history is not on file. Social History:  reports that she has never smoked. She has never used smokeless tobacco. She reports that she does not drink alcohol or use illicit drugs.   Prenatal Transfer Tool  Maternal Diabetes: No Genetic Screening: Too Late Maternal Ultrasounds/Referrals: Normal Fetal Ultrasounds or other Referrals:  None Maternal Substance Abuse:  No Significant Maternal Medications:  None Significant Maternal Lab Results:  Lab values include: HBsAG positive  ROS Per HPI   Last menstrual period 11/29/2011. Exam Physical Exam  Gen: appeared in mild distress secondary to pain.   Neuro: no focal deficits. Cervical Exam:  Dilation: 10 Dilation Complete Date: 08/22/12 Dilation Complete Time: 1858 Effacement (%): 80 Cervical Position: Anterior Station: Ballotable;-1 Presentation: Vertex Exam by:: Glendell Docker, MD Resident  Prenatal labs: ABO, Rh: A/POS/-- (03/10 1044) Antibody: NEG (03/10 1044) Rubella: 4.57 (03/10 1044) RPR: NON REAC (03/10 1044)  HBsAg: POSITIVE (03/10 1044)  HIV: NON REACTIVE (03/10 1044)  GBS:   Negative  Assessment/Plan: Tayvia Faughnan is a 32 y.o. G7P7007 at [redacted]w[redacted]d who presents in active labor.  - Give grand multiparity status patient was seen  quickly in the [facility].  Cervix noted to be 5-6 cm dilated. - History and PE limited secondary to language barrier and quickly advancing labor. - Will admit to L&D - Pain control: Labor support - Anticipate NSVD.  Everlene Other 08/22/2012, 6:25 PM    Evaluation and management procedures were performed by Resident physician under my supervision/collaboration. Chart reviewed, patient examined by me and I agree with management and plan.

## 2012-08-23 ENCOUNTER — Encounter: Payer: Medicaid Other | Admitting: Obstetrics and Gynecology

## 2012-08-23 LAB — CBC
HCT: 30.9 % — ABNORMAL LOW (ref 36.0–46.0)
Hemoglobin: 10.3 g/dL — ABNORMAL LOW (ref 12.0–15.0)
MCH: 28.1 pg (ref 26.0–34.0)
MCV: 84.2 fL (ref 78.0–100.0)
Platelets: 156 10*3/uL (ref 150–400)
RBC: 3.67 MIL/uL — ABNORMAL LOW (ref 3.87–5.11)
WBC: 10.5 10*3/uL (ref 4.0–10.5)

## 2012-08-23 LAB — RPR: RPR Ser Ql: NONREACTIVE

## 2012-08-23 NOTE — Progress Notes (Signed)
Post Partum Day 1 Subjective: no complaints, up ad lib, voiding and + flatus  Objective: Blood pressure 104/70, pulse 68, temperature 97.1 F (36.2 C), temperature source Oral, resp. rate 18, height 5\' 3"  (1.6 m), weight 76.204 kg (168 lb), last menstrual period 11/29/2011, unknown if currently breastfeeding.  Physical Exam:  General: alert and cooperative Lochia: appropriate Uterine Fundus: firm Incision: N/A DVT Evaluation: No evidence of DVT seen on physical exam. No cords or calf tenderness. No significant calf/ankle edema.   Recent Labs  08/22/12 1855 08/23/12 0615  HGB 11.2* 10.3*  HCT 33.9* 30.9*    Assessment/Plan: A:  32 y.o PPD #1 doing well. Plan for discharge tomorrow.  P: 1. HBIG and HepB vaccine for baby prior to discharge 2. Nexplanon for contraception 3. Follow up in [facility] 6 weeks for postpartum checkup   LOS: 1 day   Arther Abbott 08/23/2012, 7:27 AM

## 2012-08-23 NOTE — Progress Notes (Signed)
Ur chart review completed.  

## 2012-08-23 NOTE — Progress Notes (Signed)
CSW referral received for [facility] @ 26 weeks & MOB's + Hep B status.  CSW did not meet with pt, since referral did not meet criteria for automatic CSW consult.

## 2012-08-23 NOTE — Progress Notes (Signed)
I have seen and examined this patient and I agree with the above. Pt breastfeeding. Cam Hai 7:48 AM 08/23/2012

## 2012-08-24 MED ORDER — IBUPROFEN 600 MG PO TABS: 600.0000 mg | ORAL_TABLET | Freq: Four times a day (QID) | ORAL | Status: DC

## 2012-08-24 MED ORDER — SENNOSIDES-DOCUSATE SODIUM 8.6-50 MG PO TABS: 2.0000 | ORAL_TABLET | Freq: Every day | ORAL | Status: DC

## 2012-08-24 NOTE — Discharge Summary (Signed)
Obstetric Discharge Summary Reason for Admission: onset of labor Prenatal Procedures: ultrasound Intrapartum Procedures: spontaneous vaginal delivery Postpartum Procedures: none Complications-Operative and Postpartum: none Hemoglobin  Date Value Range Status  08/23/2012 10.3* 12.0 - 15.0 g/dL Final     HCT  Date Value Range Status  08/23/2012 30.9* 36.0 - 46.0 % Final    Physical Exam:  General: alert and cooperative Lochia: appropriate Uterine Fundus: firm Incision: N/A DVT Evaluation: No evidence of DVT seen on physical exam. No cords or calf tenderness. No significant calf/ankle edema.  Discharge Diagnoses: Term Pregnancy-delivered  Discharge Information: Date: 08/24/2012 Activity: pelvic rest Diet: routine Medications: Ibuprofen Condition: stable Instructions: refer to practice specific booklet Discharge to: home Follow-up Information   Follow up with [facility]. Call in 6 weeks. (Postpartum Appointment)       Newborn Data: Live born female  Birth Weight: 7 lb (3175 g) APGAR: 9, 9 Given HBIG, HepB vaccination yesterday, 6/11. Recommend serologic testing within 1-3 months after completion of vaccination series. Child will require a fourth dose of vaccine as opposed to typical 3-shot series.  Home with mother.  Arther Abbott 08/24/2012, 7:18 AM2  I saw and examined patient and agree with above student note. I reviewed history, delivery summary, labs and vitals. Pt with negligible Hep B viral load - can follow up with Infectious Disease to monitor viral status if desired. Napoleon Form, MD

## 2012-08-24 NOTE — H&P (Signed)
Attestation of Attending Supervision of Advanced Practitioner (PA/CNM/NP): Evaluation and management procedures were performed by the Advanced Practitioner under my supervision and collaboration.  I have reviewed the Advanced Practitioner's note and chart, and I agree with the management and plan.  Romuald Mccaslin, MD, FACOG Attending Obstetrician & Gynecologist Faculty Practice, [facility]  

## 2012-09-20 ENCOUNTER — Ambulatory Visit: Payer: Medicaid Other | Admitting: Advanced Practice Midwife

## 2012-09-20 ENCOUNTER — Ambulatory Visit (INDEPENDENT_AMBULATORY_CARE_PROVIDER_SITE_OTHER): Payer: Medicaid Other | Admitting: Family Medicine

## 2012-09-20 ENCOUNTER — Encounter: Payer: Self-pay | Admitting: Family Medicine

## 2012-09-20 DIAGNOSIS — Z3049 Encounter for surveillance of other contraceptives: Secondary | ICD-10-CM

## 2012-09-20 MED ORDER — ETONOGESTREL 68 MG ~~LOC~~ IMPL
68.0000 mg | DRUG_IMPLANT | Freq: Once | SUBCUTANEOUS | Status: AC
  Administered 2012-09-20: 68 mg via SUBCUTANEOUS

## 2012-09-20 NOTE — Progress Notes (Unsigned)
  Subjective:     Renee Zavala is a 32 y.o. female who presents for a postpartum visit. She is 4 weeks postpartum following a spontaneous vaginal delivery. I have fully reviewed the prenatal and intrapartum course. The delivery was at 38 gestational weeks. Outcome: spontaneous vaginal delivery. Anesthesia: epidural. Postpartum course has been normal. Baby's course has been uneventful. Baby is feeding by breast. Bleeding no bleeding. Bowel function is normal. Bladder function is normal. Patient is not sexually active. Contraception method is Nexplanon. Postpartum depression screening: negative.  The following portions of the patient's history were reviewed and updated as appropriate: allergies, current medications, past family history, past medical history, past social history, past surgical history and problem list.  Review of Systems Pertinent items are noted in HPI.   Objective:    BP 134/94  Pulse 77  Temp(Src) 97.2 F (36.2 C) (Oral)  Ht 5\' 1"  (1.549 m)  Wt 155 lb (70.308 kg)  BMI 29.3 kg/m2  Breastfeeding? Yes  General:  alert, cooperative and appears stated age   Procedure: Patient given informed consent, signed copy in the chart, time out was performed. Pregnancy test was negative Appropriate time out taken.  Patient's left arm was prepped and draped in the usual sterile fashion.. The ruler used to measure and mark insertion area.  Pt was prepped with alcohol swab and then injected with 3 cc of 1% lidocaine with epinephrine.  Pt was prepped with betadine, Implanon removed form packaging,  Device confirmed in needle, then inserted full length of needle and withdrawn per handbook instructions.  Pt insertion site covered with 4 x 4.   Minimal blood loss.  Pt tolerated the procedure well.         Assessment:     Normal postpartum exam. Pap smear not done at today's visit.   Plan:    1. Contraception: Nexplanon  2. Follow up in: 9 months or as needed.

## 2012-09-20 NOTE — Patient Instructions (Signed)
Etonogestrel implant  Ni dawa hii?  ETONOGESTREL ni uzazi wa mpango Leonidas Romberg) kifaa. Ni kutumika kuzuia mimba. Inaweza kutumika kwa hadi miaka 3.  Dawa hii inaweza kutumika kwa madhumuni mengine; [place] mtoa huduma ya afya au mfamasia kama una [place].  Nifanye [place] mtoa huduma ya afya yangu kabla ya kuchukua dawa hii?  [place] kujua kama una yoyote ya masharti hayo:  -usiokuwa wa Mosetta Pigeon na damu ukeni  damu ya ugonjwa chombo au madonge ya damu  -kansa ya matiti, mfuko wa [place], au ini  -huzuni  ya ugonjwa wa kisukari  -ugonjwa wa nyongo  -maumivu ya kichwa  -ugonjwa wa moyo au ya hivi karibuni mshtuko wa moyo  -shinikizo la damu  -high cholesterol  ugonjwa wa figo- -ugonjwa wa ini  magonjwa ya figo  -kifafa  tumbaku mvutaji  -kawaida au mzio wa etonogestrel, homoni nyingine, usingizi au antiseptics, madawa, vyakula, dyes, au preservatives  mimba au kujaribu kupata mimba  -[place]  Jinsi gani mimi kutumia dawa?  Kifaa hiki ni kuingizwa tu chini ya ngozi upande wa ndani wa mkono wako juu na huduma za afya ya [place].  Kuzungumza na daktari wa watoto wako kuhusu matumizi ya dawa hii kwa watoto. Huduma maalum inaweza kuwa inahitajika.  Overdosage: Kama unafikiri wameweza kuchukuliwa sana ya ya mawasiliano dawa kituo cha kudhibiti sumu au chumba cha dharura mara moja.  Overdosage: Kama unafikiri wamechukua sana ya ya mawasiliano dawa kituo cha kudhibiti sumu au chumba cha dharura mara moja.  NOTE: Dawa hii ni kwa ajili yenu. [place] dawa hii na watu wengine.  Nini kama mimi miss dozi?  Hii haina kuomba.  Nini huweza kuingiliana kiutendaji na dawa hii?  Je, si kuchukua dawa hii na yoyote ya dawa zifuatazo:  -amprenavir  -bosentan  -fosamprenavir  Dawa hii huweza kuingiliana kiutendaji na dawa zifuatazo:  -barbiturate madawa kwa inducing usingizi au kutibu kifafa  -baadhi ya dawa kwa ajili ya maambukizi ya vimelea kama ketoconazole na itraconazole   -griseofulvin  -madawa ya kutibu kifafa kama kabamazefini, felbamate, oxcarbazepine, phenytoin, topirameti  -modafinili  -phenylbutazone  -rifampin  -baadhi madawa ya kutibu maambukizi ya VVU kama atazanavir, Indinavir, lopinavir, nelfinavir, tipranavir, ritonavir  -St. Wort Cana  Orodha hii inaweza kuelezea ushirikiano wote [place]. [place] mhudumu wako wa afya orodha ya madawa yote, mimea, madawa ya kulevya mashirika yasiyo ya dawa, au malazi virutubisho kutumia. Pia [place] kama moshi, Fidelity pombe, au kutumia madawa ya [place]. Baadhi ya vitu huweza kuingiliana kiutendaji na dawa zako.  Nifanye kuangalia kwa wakati kwa kutumia dawa hii?  Bidhaa hii haina kinga dhidi ya maambukizi ya VVU (UKIMWI) au magonjwa mengine ya zinaa.  Unapaswa kuwa na Brennan Bailey wa kuhisi implant na uendelezaji wa vidole kwenye ngozi ya ambapo [place]. [place]ie daktari wako kama huwezi kujisikia implant.  Madhara gani yatokanayo na mimi taarifa Alleen Borne dawa hii?  Madhara ambayo unapaswa kuripoti na daktari wako au huduma za afya mtaalamu haraka iwezekanavyo:  mzio athari Ashley, Kerr au [place], uvimbe wa [place], midomo, [employer] au  matiti uvimbe  -mabadiliko Hughes maono  -[place], shida ya [place] au kuelewa  -giza mkojo  -huzuni mood  -jumla ya wagonjwa hisia au dalili kama za homa  viti-mwanga-rangi  -upungufu wa chakula, kichefuchefu  [place] maumivu juu ya tumbo  -kali maumivu ya [place]  -maumivu [place], [place], au huruma [place] tumbo  -upungufu wa [place], maumivu ya [place], uvimbe [place] mguu  -ishara ya mimba  -ghafla ganzi au udhaifu wa uso, mkono au mguu  -matatizo ya [place], [place], hasara ya usawa au uratibu  -  Mosetta Pigeon na damu Margarette [place]  -isiyo ya Vanetta Mulders dhaifu au amechoka  -Kateri Plummer ya macho au ngozi  Madhara ambayo kwa kawaida hayahitaji matibabu ([place] hizi na daktari wako au huduma za afya mtaalamu kama wataendelea au ni bothersome.):  Acne   matiti maumivu  -mabadiliko katika uzito  -kikohozi  -homa au baridi  -kichwa  -Vanetta Mulders hedhi  -Johnston Ebbs, moto, na ukeni  -maumivu au ugumu kupitisha mkojo  -koo  [place] hii inaweza kuelezea madhara yote [place]. Arlan Organ na daktari wako kwa ushauri wa daktari kuhusu madhara. Trinidad Curet ripoti ya madhara kwa FDA [place] [phone]-FDA-[phone].  Ambapo lazima mimi kuweka dawa yangu?  Dawa hii ni kutokana na katika hospitali au kliniki na si kuhifadhiwa nyumbani.  KUMBUKA: karatasi Huu ni muhtasari. Inaweza kufunika taarifa zote iwezekanavyo. Kama una maswali kuhusu dawa hii, [place] na daktari, [place], au mtoa huduma ya afya yako.   2013, Elsevier / Gold Standard. (11/22/2008 03:54:17)

## 2012-09-21 ENCOUNTER — Encounter: Payer: Self-pay | Admitting: *Deleted

## 2012-10-18 ENCOUNTER — Encounter (HOSPITAL_COMMUNITY): Payer: Self-pay | Admitting: Emergency Medicine

## 2012-10-18 ENCOUNTER — Emergency Department (HOSPITAL_COMMUNITY)
Admission: EM | Admit: 2012-10-18 | Discharge: 2012-10-18 | Disposition: A | Discharge status: Home / Self Care | Payer: Medicaid Other | Attending: Emergency Medicine | Admitting: Emergency Medicine

## 2012-10-18 DIAGNOSIS — Z8619 Personal history of other infectious and parasitic diseases: Secondary | ICD-10-CM | POA: Insufficient documentation

## 2012-10-18 DIAGNOSIS — L600 Ingrowing nail: Secondary | ICD-10-CM | POA: Insufficient documentation

## 2012-10-18 DIAGNOSIS — Z8719 Personal history of other diseases of the digestive system: Secondary | ICD-10-CM | POA: Insufficient documentation

## 2012-10-18 MED ORDER — IBUPROFEN 800 MG PO TABS: 800.0000 mg | ORAL_TABLET | Freq: Three times a day (TID) | ORAL | Status: DC | PRN

## 2012-10-18 MED ORDER — SULFAMETHOXAZOLE-TRIMETHOPRIM 800-160 MG PO TABS: 1.0000 | ORAL_TABLET | Freq: Two times a day (BID) | ORAL | Status: DC

## 2012-10-18 NOTE — ED Notes (Signed)
PT ambulated with baseline gait; VSS; A&Ox3; no signs of distress; respirations even and unlabored; skin warm and dry; no questions upon discharge.  

## 2012-10-18 NOTE — ED Notes (Signed)
Pt requesting antibiotics only; no surgical intervention.

## 2012-10-18 NOTE — ED Notes (Signed)
Pt with possible ingrown toenail on right great toe

## 2012-10-18 NOTE — ED Provider Notes (Signed)
  Medical screening examination/treatment/procedure(s) were performed by non-physician practitioner and as supervising physician I was immediately available for consultation/collaboration.    Gerhard Munch, MD 10/18/12 [phone]

## 2012-10-18 NOTE — ED Provider Notes (Signed)
  CSN: [id number]     Arrival date & time 10/18/12  1017 History     First MD Initiated Contact with Patient 10/18/12 1047     Chief Complaint  Patient presents with  . Nail Problem   (Consider location/radiation/quality/duration/timing/severity/associated sxs/prior Treatment) HPI Comments: Patient has had pain and swelling of right great toe x 2 months.  States she was wearing shoes that did not fit well, then began having pain and swelling and discharge from around her toenail.  Denies fevers, chills, body aches, N/V, weakness or numbness of the toe.  Denies trauma to the toe.    Initial translation by young daughter.  Language interpreter called and provided the rest of the translation.   Language: Swahili   The history is provided by the patient. A language interpreter was used.    Past Medical History  Diagnosis Date  . Hepatitis B surface antigen positive, currently pregnant   . Infection    Past Surgical History  Procedure Laterality Date  . No past surgeries     History reviewed. No pertinent family history. History  Substance Use Topics  . Smoking status: Never Smoker   . Smokeless tobacco: Never Used  . Alcohol Use: No   OB History   Grav Para Term Preterm Abortions TAB SAB Ect Mult Living   7 7 7  0 0 0 0 0 0 7     Review of Systems  Constitutional: Negative for fever and chills.  Musculoskeletal: Negative for myalgias.  Skin: Positive for wound. Negative for color change.  Neurological: Negative for weakness and numbness.    Allergies  Review of patient's allergies indicates no known allergies.  Home Medications  No current outpatient prescriptions on file. BP 146/89  Pulse 80  Temp(Src) 97.7 F (36.5 C) (Oral)  Resp 18  SpO2 100% Physical Exam  Nursing note and vitals reviewed. Constitutional: She appears well-developed and well-nourished. No distress.  HENT:  Head: Normocephalic and atraumatic.  Neck: Neck supple.  Pulmonary/Chest: Effort  normal.  Musculoskeletal:       Feet:  Neurological: She is alert.  Skin: She is not diaphoretic.    ED Course   Procedures (including critical care time)  Labs Reviewed - No data to display No results found.  Declines procedure, requests antibiotics.   1. Ingrown right greater toenail     MDM  Pt with apparent ingrown toenail involving both sides of her great toe, right foot.  Patient given the option for me to numb her toe and remove the offending pieces of nail vs d/c home with antibiotic, pain medication, podiatry follow up.  Pt declined procedure by me today.  Aware she will need to follow up.  Pt given verbal and written return precautions.  Pt verbalizes understanding and agrees with plan.     Trixie Dredge, PA-C 10/18/12 1534

## 2014-01-14 ENCOUNTER — Encounter (HOSPITAL_COMMUNITY): Payer: Self-pay | Admitting: Emergency Medicine

## 2015-10-09 ENCOUNTER — Ambulatory Visit: Payer: Medicaid Other | Admitting: Obstetrics & Gynecology

## 2016-03-11 ENCOUNTER — Encounter (HOSPITAL_COMMUNITY): Payer: Self-pay | Admitting: *Deleted

## 2016-03-11 ENCOUNTER — Inpatient Hospital Stay (HOSPITAL_COMMUNITY): Payer: Medicaid Other

## 2016-03-11 ENCOUNTER — Inpatient Hospital Stay (HOSPITAL_COMMUNITY)
Admission: AD | Admit: 2016-03-11 | Discharge: 2016-03-11 | Disposition: A | Discharge status: Home / Self Care | Payer: Medicaid Other | Source: Ambulatory Visit | Attending: Obstetrics & Gynecology | Admitting: Obstetrics & Gynecology

## 2016-03-11 DIAGNOSIS — O209 Hemorrhage in early pregnancy, unspecified: Secondary | ICD-10-CM | POA: Diagnosis present

## 2016-03-11 DIAGNOSIS — O4690 Antepartum hemorrhage, unspecified, unspecified trimester: Secondary | ICD-10-CM | POA: Diagnosis not present

## 2016-03-11 DIAGNOSIS — Z3A11 11 weeks gestation of pregnancy: Secondary | ICD-10-CM | POA: Diagnosis not present

## 2016-03-11 DIAGNOSIS — O034 Incomplete spontaneous abortion without complication: Secondary | ICD-10-CM | POA: Diagnosis not present

## 2016-03-11 LAB — CBC
HCT: 31.1 % — ABNORMAL LOW (ref 36.0–46.0)
Hemoglobin: 10.6 g/dL — ABNORMAL LOW (ref 12.0–15.0)
MCH: 31.1 pg (ref 26.0–34.0)
MCHC: 34.1 g/dL (ref 30.0–36.0)
MCV: 91.2 fL (ref 78.0–100.0)
PLATELETS: 163 10*3/uL (ref 150–400)
RBC: 3.41 MIL/uL — ABNORMAL LOW (ref 3.87–5.11)
RDW: 13.2 % (ref 11.5–15.5)
WBC: 6.1 10*3/uL (ref 4.0–10.5)

## 2016-03-11 LAB — URINALYSIS, ROUTINE W REFLEX MICROSCOPIC
Bilirubin Urine: NEGATIVE
Glucose, UA: NEGATIVE mg/dL
KETONES UR: NEGATIVE mg/dL
Nitrite: NEGATIVE
PROTEIN: NEGATIVE mg/dL
Specific Gravity, Urine: 1.024 (ref 1.005–1.030)
pH: 5 (ref 5.0–8.0)

## 2016-03-11 LAB — POCT PREGNANCY, URINE: Preg Test, Ur: POSITIVE — AB

## 2016-03-11 LAB — WET PREP, GENITAL
CLUE CELLS WET PREP: NONE SEEN
Sperm: NONE SEEN
TRICH WET PREP: NONE SEEN
Yeast Wet Prep HPF POC: NONE SEEN

## 2016-03-11 LAB — HCG, QUANTITATIVE, PREGNANCY: HCG, BETA CHAIN, QUANT, S: 10132 m[IU]/mL — AB (ref <5)

## 2016-03-11 NOTE — Discharge Instructions (Signed)

## 2016-03-11 NOTE — MAU Note (Signed)
Pt had implanon removed in September, had a period in October but none until she started bleeding yesterday, is concerned she may be pregnant.  Denies pain.

## 2016-03-11 NOTE — MAU Provider Note (Signed)
Chief Complaint: Vaginal Bleeding  First Provider Initiated Contact with Patient 03/11/16 1921      SUBJECTIVE HPI: Renee Zavala is a 35 y.o. [id number] at 1338w0d by last menstrual period who presents to Maternity Admissions reporting vaginal bleeding. No other testing this pregnancy  Associated signs and symptoms: Negative for fever, chills, abdominal pain, dizziness, passage of tissue or clots.  A+  Past Medical History:  Diagnosis Date  . Hepatitis B surface antigen positive, currently pregnant (HCC)   . Infection    OB History  Gravida Para Term Preterm AB Living  8 7 7  0 0 7  SAB TAB Ectopic Multiple Live Births  0 0 0 0 7    # Outcome Date GA Lbr Len/2nd Weight Sex Delivery Anes PTL Lv  8 Current           7 Term 08/22/12 8063w1d 03:58 / 00:06 7 lb (3.175 kg) M Vag-Spont None  LIV  6 Term 10/10/10 4615w4d / 00:06 7 lb 14.8 oz (3.595 kg) M Vag-Spont None  LIV  5 Term 12/2008    F Vag-Spont None N LIV  4 Term 02/2008    M Vag-Spont None N LIV  3 Term 08/2005    F Vag-Spont None N LIV  2 Term 04/2003    M Vag-Spont None N LIV  1 Term 10/2001    F Vag-Spont None N LIV     Past Surgical History:  Procedure Laterality Date  . NO PAST SURGERIES     Social History   Social History  . Marital status: Married    Spouse name: N/A  . Number of children: N/A  . Years of education: N/A   Occupational History  . Not on file.   Social History Main Topics  . Smoking status: Never Smoker  . Smokeless tobacco: Never Used  . Alcohol use No  . Drug use: No  . Sexual activity: Yes   Other Topics Concern  . Not on file   Social History Narrative  . No narrative on file   No current facility-administered medications on file prior to encounter.    No current outpatient prescriptions on file prior to encounter.   No Known Allergies  I have reviewed the past Medical Hx, Surgical Hx, Social Hx, Allergies and Medications.   Review of Systems  Constitutional: Negative for  chills and fever.  Gastrointestinal: Negative for abdominal pain, blood in stool, constipation, diarrhea, nausea and vomiting.  Genitourinary: Positive for vaginal bleeding. Negative for dysuria, hematuria and vaginal discharge.  Neurological: Negative for dizziness.    OBJECTIVE Patient Vitals for the past 24 hrs:  BP Temp Temp src Pulse Resp SpO2 Height Weight  03/11/16 2158 124/76 - - 75 18 99 % - -  03/11/16 1808 130/82 98.5 F (36.9 C) Oral 71 18 - 5' 0.5" (1.537 m) 160 lb (72.6 kg)   Constitutional: Well-developed, well-nourished female in no acute distress.  Skin: No pallor. Cardiovascular: normal rate Respiratory: normal rate and effort.  GI: Abd soft, non-tender. Pos BS x 4 MS: Extremities nontender, no edema, normal ROM Neurologic: Alert and oriented x 4.  GU: Neg CVAT.  SPECULUM EXAM: NEFG, small amount of bright red blood noted, cervix clean  BIMANUAL: cervix closed; uterus 8-10 week size, no adnexal tenderness or masses. No CMT.  LAB RESULTS Results for orders placed or performed during the hospital encounter of 03/11/16 (from the past 24 hour(s))  Urinalysis, Routine w reflex microscopic     Status:  Abnormal   Collection Time: 03/11/16  5:55 PM  Result Value Ref Range   Color, Urine YELLOW YELLOW   APPearance HAZY (A) CLEAR   Specific Gravity, Urine 1.024 1.005 - 1.030   pH 5.0 5.0 - 8.0   Glucose, UA NEGATIVE NEGATIVE mg/dL   Hgb urine dipstick LARGE (A) NEGATIVE   Bilirubin Urine NEGATIVE NEGATIVE   Ketones, ur NEGATIVE NEGATIVE mg/dL   Protein, ur NEGATIVE NEGATIVE mg/dL   Nitrite NEGATIVE NEGATIVE   Leukocytes, UA TRACE (A) NEGATIVE   RBC / HPF 6-30 0 - 5 RBC/hpf   WBC, UA 0-5 0 - 5 WBC/hpf   Bacteria, UA RARE (A) NONE SEEN   Squamous Epithelial / LPF 0-5 (A) NONE SEEN   Mucous PRESENT   Pregnancy, urine POC     Status: Abnormal   Collection Time: 03/11/16  6:26 PM  Result Value Ref Range   Preg Test, Ur POSITIVE (A) NEGATIVE  hCG, quantitative,  pregnancy     Status: Abnormal   Collection Time: 03/11/16  6:58 PM  Result Value Ref Range   hCG, Beta Chain, Quant, S 10,132 (H) <5 mIU/mL  CBC     Status: Abnormal   Collection Time: 03/11/16  6:58 PM  Result Value Ref Range   WBC 6.1 4.0 - 10.5 K/uL   RBC 3.41 (L) 3.87 - 5.11 MIL/uL   Hemoglobin 10.6 (L) 12.0 - 15.0 g/dL   HCT 16.131.1 (L) 09.636.0 - 04.546.0 %   MCV 91.2 78.0 - 100.0 fL   MCH 31.1 26.0 - 34.0 pg   MCHC 34.1 30.0 - 36.0 g/dL   RDW 40.913.2 81.111.5 - 91.415.5 %   Platelets 163 150 - 400 K/uL  Wet prep, genital     Status: Abnormal   Collection Time: 03/11/16  7:28 PM  Result Value Ref Range   Yeast Wet Prep HPF POC NONE SEEN NONE SEEN   Trich, Wet Prep NONE SEEN NONE SEEN   Clue Cells Wet Prep HPF POC NONE SEEN NONE SEEN   WBC, Wet Prep HPF POC MODERATE (A) NONE SEEN   Sperm NONE SEEN     IMAGING [place] Ob Comp Less 14 Wks  Result Date: 03/11/2016 CLINICAL DATA:  Pregnant patient in first-trimester pregnancy with vaginal bleeding since yesterday. EXAM: OBSTETRIC <14 WK US AND TRANSVAGINAL OB US TECHNIQUE: Both transabdominal and transvaginal ultrasound examinations were performed for complete evaluation of the gestation as well as the maternal uterus, adnexal regions, and pelvic cul-de-sac. Transvaginal technique was performed to assess early pregnancy. COMPARISON:  None this pregnancy. FINDINGS: Intrauterine gestational sac: Single Yolk sac:  Not Visualized. Embryo:  Visualized. Cardiac Activity: Not Visualized. CRL:  17.3  mm   8 w   1 d                  US EDC: 10/20/2016 Subchorionic hemorrhage:  None visualized. Maternal uterus/adnexae: Both ovaries are well visualized and are normal. There is no pelvic free fluid. IMPRESSION: Intrauterine pregnancy (fetal crown-rump length 17.3 mm) with absent fetal cardiac activity. Findings meet definitive criteria for failed pregnancy. This follows SRU consensus guidelines: Diagnostic Criteria for Nonviable Pregnancy Early in the First Trimester. Macy Mis  Engl J Med [id number]:1443-51. Electronically Signed   By: Rubye OaksMelanie  Ehinger M.D.   On: 03/11/2016 21:01   [place] Ob Transvaginal  Result Date: 03/11/2016 CLINICAL DATA:  Pregnant patient in first-trimester pregnancy with vaginal bleeding since yesterday. EXAM: OBSTETRIC <14 WK US AND TRANSVAGINAL OB US TECHNIQUE: Both transabdominal  and transvaginal ultrasound examinations were performed for complete evaluation of the gestation as well as the maternal uterus, adnexal regions, and pelvic cul-de-sac. Transvaginal technique was performed to assess early pregnancy. COMPARISON:  None this pregnancy. FINDINGS: Intrauterine gestational sac: Single Yolk sac:  Not Visualized. Embryo:  Visualized. Cardiac Activity: Not Visualized. CRL:  17.3  mm   8 w   1 d                  [place] EDC: 10/20/2016 Subchorionic hemorrhage:  None visualized. Maternal uterus/adnexae: Both ovaries are well visualized and are normal. There is no pelvic free fluid. IMPRESSION: Intrauterine pregnancy (fetal crown-rump length 17.3 mm) with absent fetal cardiac activity. Findings meet definitive criteria for failed pregnancy. This follows SRU consensus guidelines: Diagnostic Criteria for Nonviable Pregnancy Early in the First Trimester. Macy Mis J Med [phone]. Electronically Signed   By: Rubye Oaks M.D.   On: 03/11/2016 21:01    MAU COURSE CBC, Quant, ABO/Rh, ultrasound, wet prep and GC/chlamydia culture, UA  MDM Pain and bleeding in early pregnancy with 8 week fetal demise, hemodynamically stable.  Lengthy discussion with patient about diagnosis of missed AB. Offered interpreter again, but patient refuses. Verbalizes understanding of what CNM is saying, but reports that she feels the baby move and thinks it is still alive. Discussed management options for SAB including expectant management, Cytotec or D&C. Considering patient's difficulty accepting the diagnosis, will expectantly manage.  ASSESSMENT 1. Incomplete miscarriage   2.  Vaginal bleeding in pregnancy     PLAN Discharge home in stable condition. Bleeding precautions Support given. Pregnancy verification letter given.  Follow-up Information    [facility] [place] [facility]TR[facility] Follow up.   Specialty:  Radiology Why:  Will call you to schedule a follow-up ultrasound in 7-10 days. Please allow 2 hours for ultrasound and follow-up appointment at [facility] afterward. Contact information: [address] [id number] mc [place] [address] [phone]       [facility] Follow up.   Specialty:  Obstetrics and Gynecology Why:  Call the office if you have not received an ultrasound appointment in the next week Contact information: [address] [place] [address] [phone]       [facility] [place] MATERN[facility] ADMISSIONS Follow up.   Why:  As needed in emergencies (fever greater than 100.4, severe bleeding or severe pain) Contact information: [address] [id number] mc [place] [address] [phone]         Allergies as of 03/11/2016   No Known Allergies     Medication List    You have not been prescribed any medications.      [place], CNM 03/11/2016  9:59 PM  4

## 2016-03-12 LAB — GC/CHLAMYDIA PROBE AMP (~~LOC~~) NOT AT ARMC
Chlamydia: NEGATIVE
Neisseria Gonorrhea: NEGATIVE

## 2016-03-12 LAB — HIV ANTIBODY (ROUTINE TESTING W REFLEX): HIV SCREEN 4TH GENERATION: NONREACTIVE

## 2016-03-18 ENCOUNTER — Encounter (HOSPITAL_COMMUNITY): Payer: Self-pay | Admitting: *Deleted

## 2016-03-18 ENCOUNTER — Ambulatory Visit (HOSPITAL_COMMUNITY): Payer: Self-pay

## 2016-03-22 ENCOUNTER — Ambulatory Visit: Payer: Self-pay | Admitting: *Deleted

## 2016-03-22 ENCOUNTER — Ambulatory Visit (HOSPITAL_COMMUNITY)
Admission: RE | Admit: 2016-03-22 | Discharge: 2016-03-22 | Disposition: A | Discharge status: Home / Self Care | Payer: Medicaid Other | Source: Ambulatory Visit | Attending: Advanced Practice Midwife | Admitting: Advanced Practice Midwife

## 2016-03-22 DIAGNOSIS — O034 Incomplete spontaneous abortion without complication: Secondary | ICD-10-CM | POA: Diagnosis present

## 2016-03-22 DIAGNOSIS — O039 Complete or unspecified spontaneous abortion without complication: Secondary | ICD-10-CM

## 2016-03-22 DIAGNOSIS — O4690 Antepartum hemorrhage, unspecified, unspecified trimester: Secondary | ICD-10-CM

## 2016-03-22 NOTE — Progress Notes (Signed)
Pt arrived from US dept with her husband to receive results. Results reviewed with Dr. Shawnie PonsPratt prior to speaking to pt and husband. [place] interpreter # [phone] used for encounter for pt's benefit however her husband does speak [place]. After some questioning, pt stated that she passed "it" on the Friday following her visit to MAU. She denies having pain. She currently has very light pink/whitish discharge. I advised that this is normal. Pt's husband stated that they came today to be sure there is no "stuff" left as they had heard this can happen. I confirmed that the ultrasound does not show a pregnancy any longer. She can expect to have a menstrual cycle in about a month. She should return to MAU if she develops heavy bright red vaginal bleeding or abdominal pain. It is advised that she not become pregnant for approximately 3 months to allow complete healing following the miscarriage. Pt was offered follow up appt in office if she desired contraception and she declined. Pt wanted to know why she had a miscarriage and I responded that we do not know. Pt and husband had no further questions. Total time with pt and interpreter = 25 minutes.

## 2016-03-23 ENCOUNTER — Encounter: Payer: Self-pay | Admitting: Advanced Practice Midwife

## 2017-01-14 ENCOUNTER — Encounter (HOSPITAL_COMMUNITY): Payer: Self-pay

## 2017-07-03 IMAGING — US US OB TRANSVAGINAL
1 series · 15 of 28 positions shown · non-contrast
Comparison: None this pregnancy.

CLINICAL DATA: Pregnant patient in first-trimester pregnancy with
vaginal bleeding since yesterday.

EXAM:
OBSTETRIC <14 WK US AND TRANSVAGINAL OB US
TECHNIQUE: Both transabdominal and transvaginal ultrasound examinations were
performed for complete evaluation of the gestation as well as the
maternal uterus, adnexal regions, and pelvic cul-de-sac.
Transvaginal technique was performed to assess early pregnancy.

[Series 1: us ob transvaginal · 38 acquisitions, 15 frames shown]
[im 1/38]
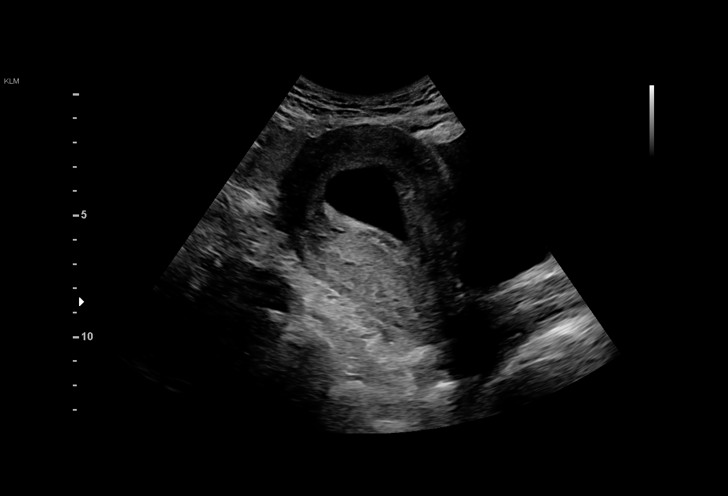
[im 3/38]
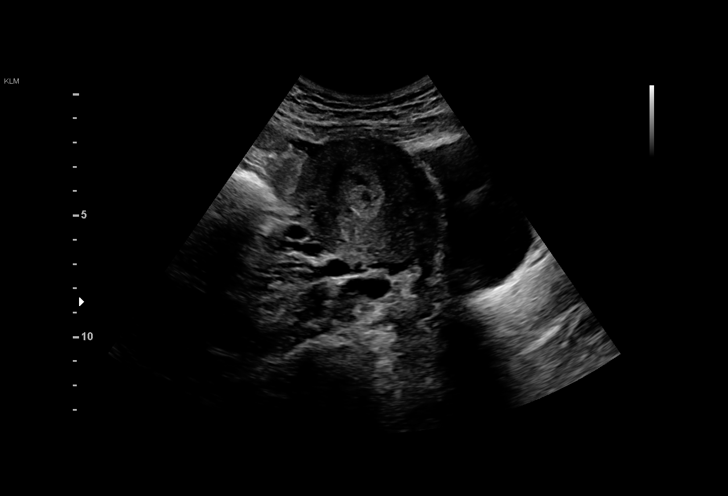
[im 6/38]
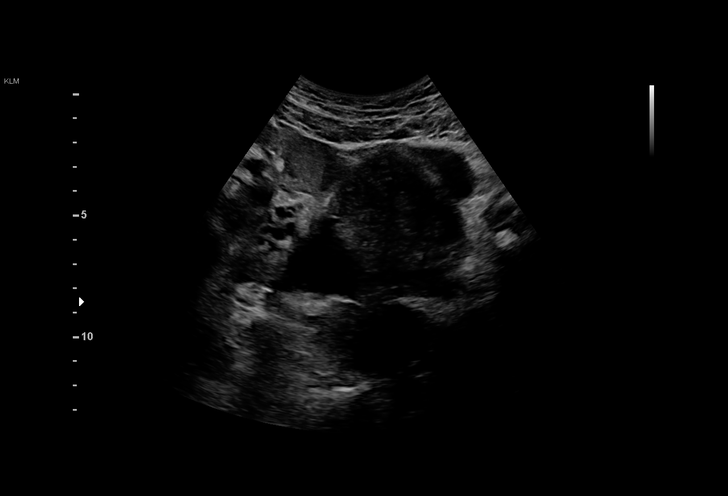
[im 9/38]
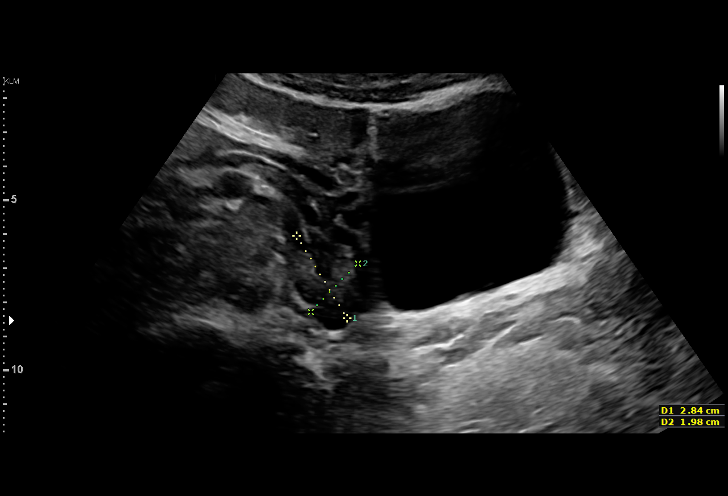
[im 11/38]
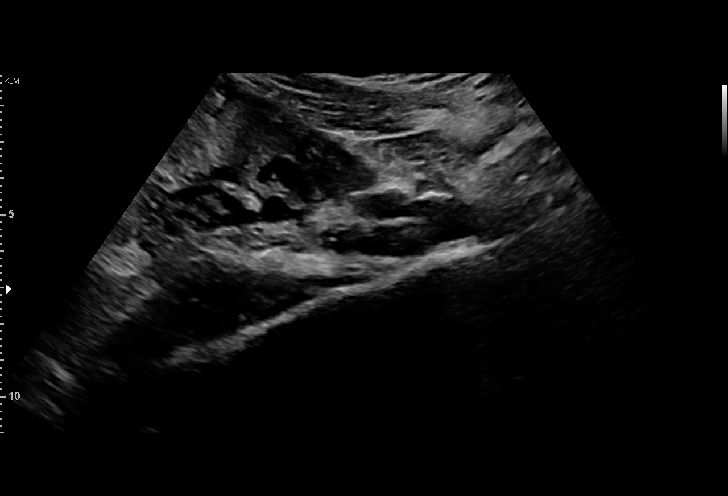
[im 14/38]
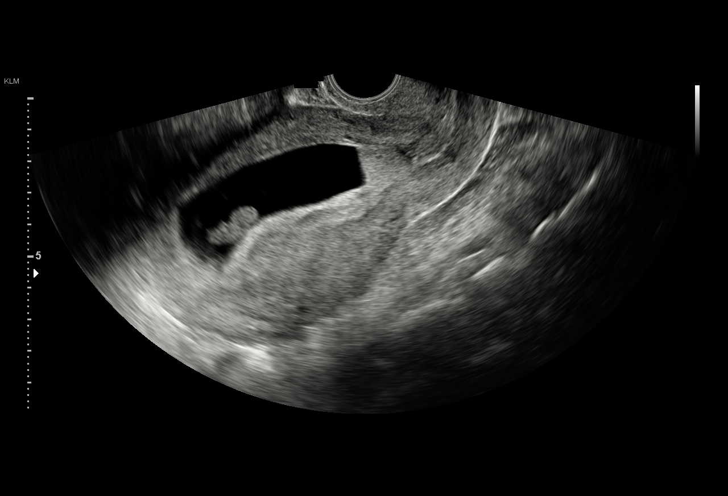
[im 17/38]
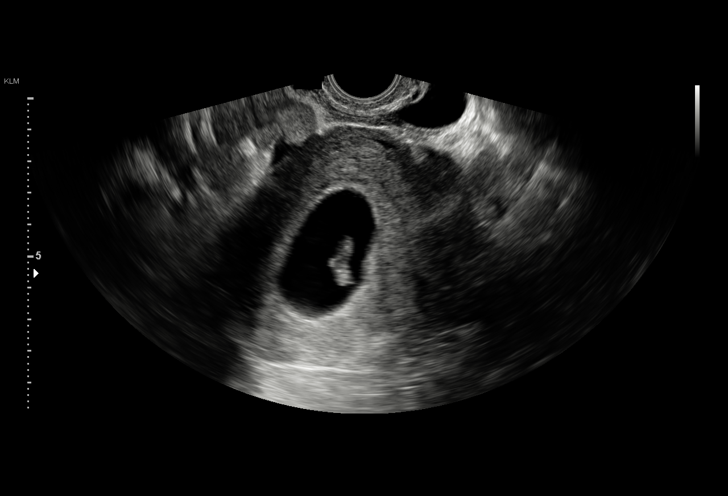
[im 20/38]
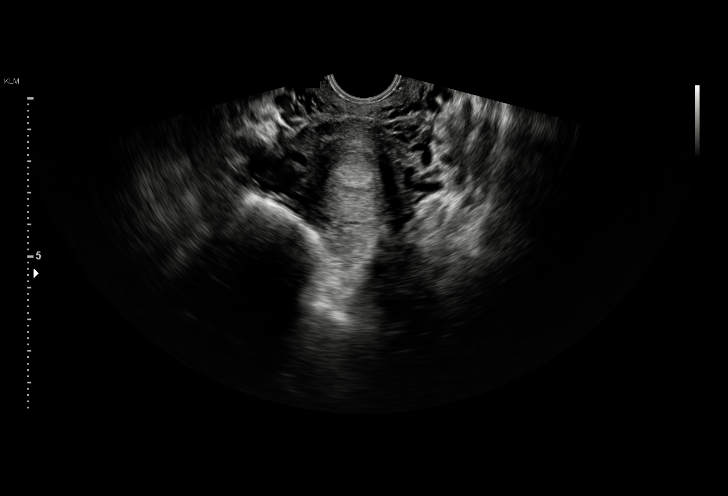
[im 21/38]
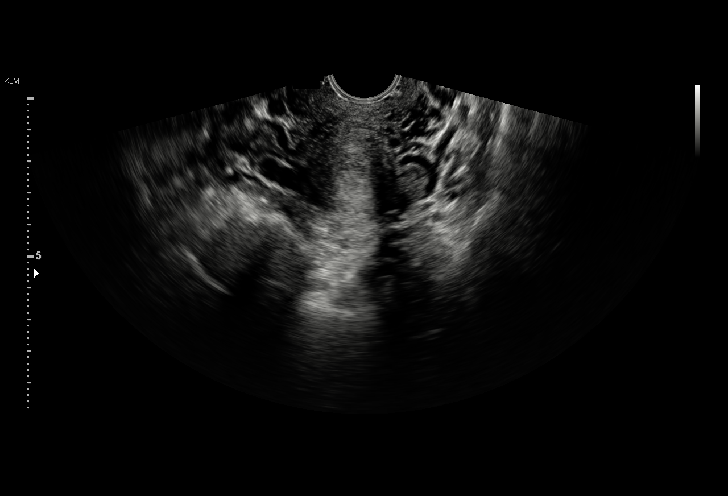
[im 24/38]
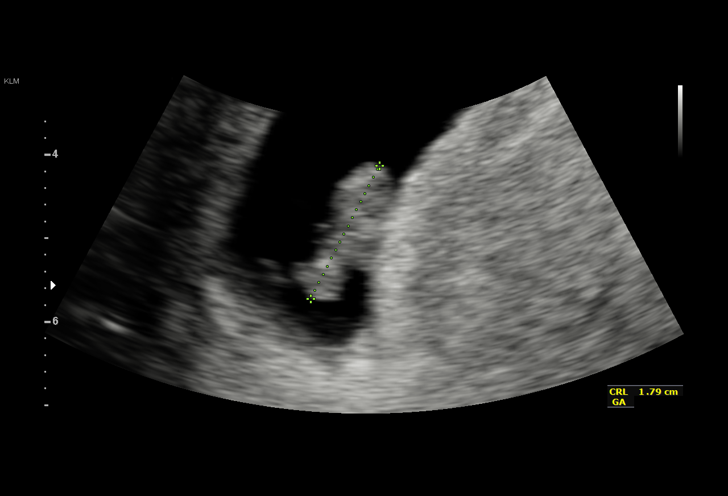
[im 27/38]
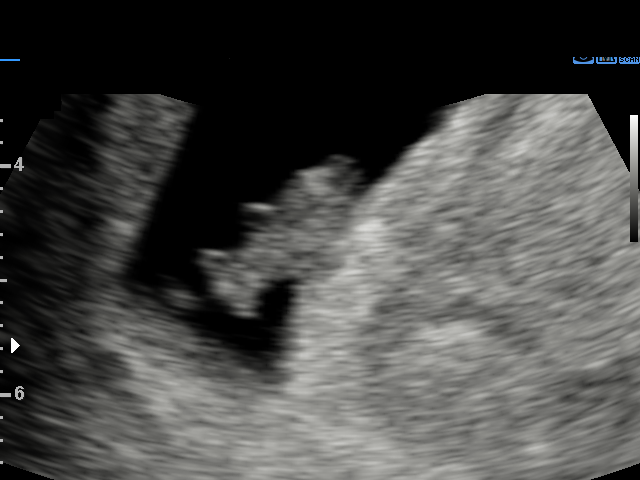
[im 29/38]
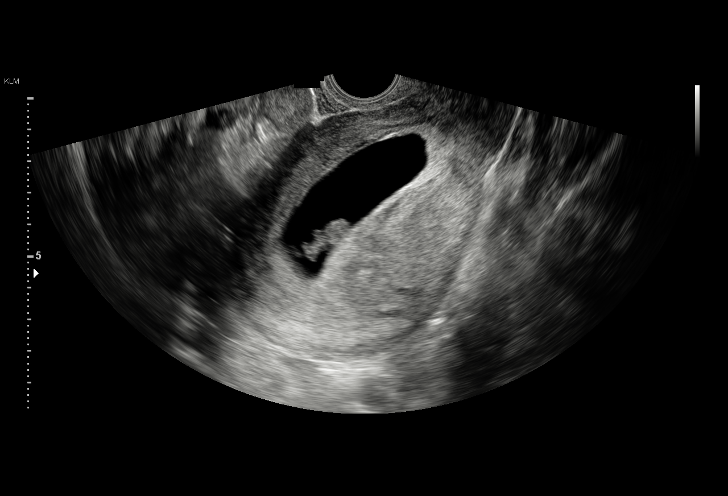
[im 32/38]
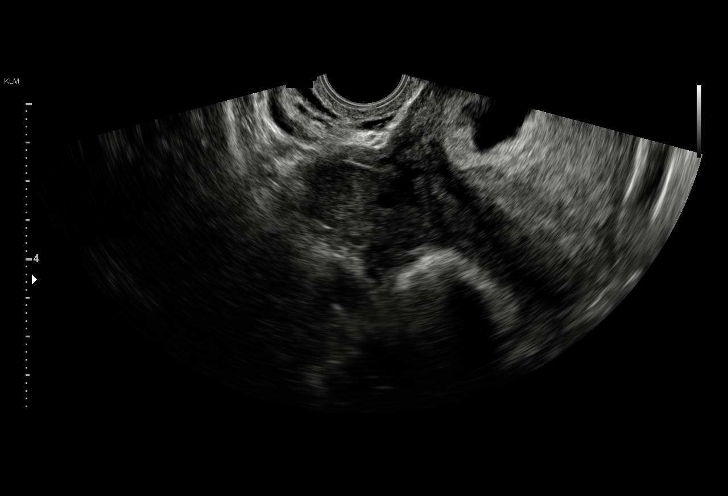
[im 35/38]
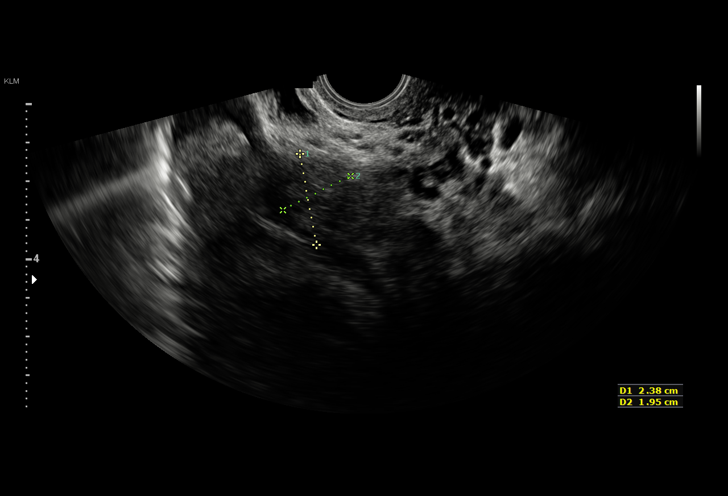
[im 38/38]
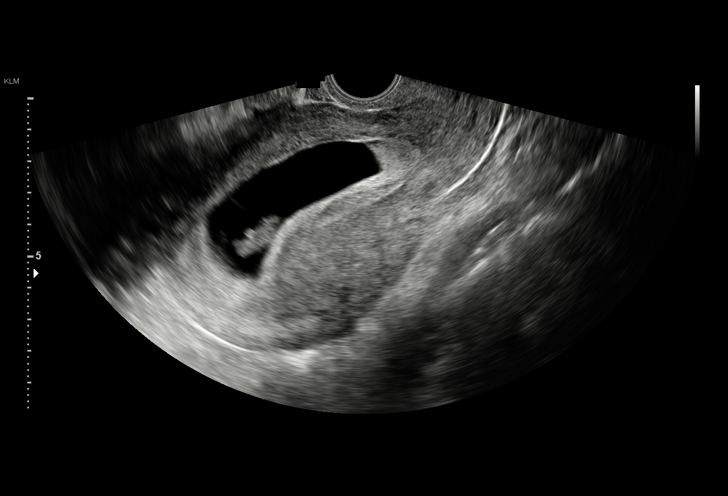

[15 of 28 positions shown; findings below may reference images not displayed]

FINDINGS: Intrauterine gestational sac: Single

Yolk sac:  Not Visualized.

Embryo:  Visualized.

Cardiac Activity: Not Visualized.

CRL:  17.3  mm   8 w   1 d                  US EDC: 10/20/2016

Subchorionic hemorrhage:  None visualized.

Maternal uterus/adnexae: Both ovaries are well visualized and are
normal. There is no pelvic free fluid.
IMPRESSION: Intrauterine pregnancy (fetal crown-rump length 17.3 mm) with absent
fetal cardiac activity. Findings meet definitive criteria for failed
pregnancy. This follows [employer] consensus guidelines: Diagnostic
Criteria for Nonviable Pregnancy Early in the First Trimester. N
Engl J Med 2868;[DATE].

## 2017-07-14 IMAGING — US US OB TRANSVAGINAL
1 series · 15 of 28 positions shown · non-contrast
Comparison: None.

CLINICAL DATA: Incomplete abortion, vaginal bleeding for 2 weeks

EXAM:
TRANSVAGINAL OB ULTRASOUND
TECHNIQUE: Transvaginal ultrasound was performed for complete evaluation of the
gestation as well as the maternal uterus, adnexal regions, and
pelvic cul-de-sac.

[Series 1: us ob transvaginal · 15 of 43 slices shown]
[im 1/43]
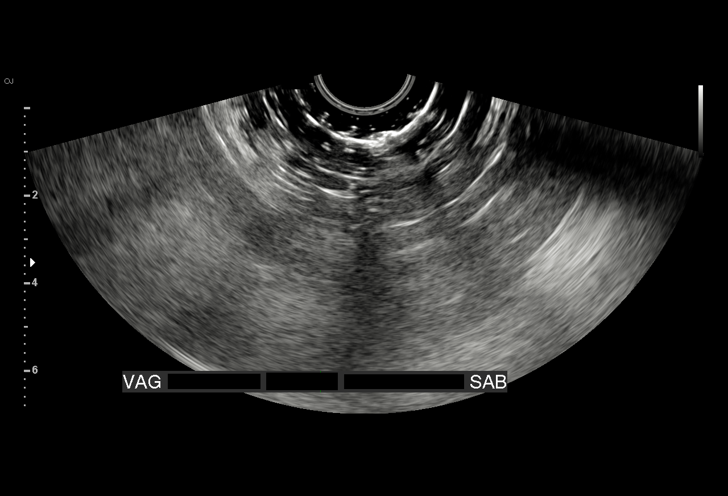
[im 4/43]
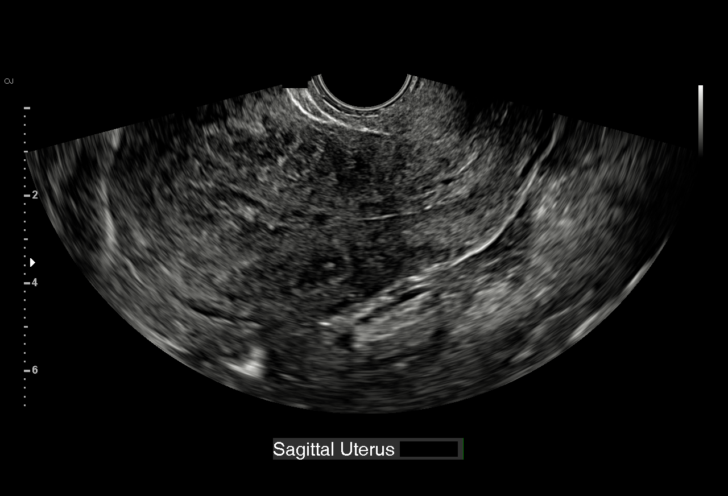
[im 7/43]
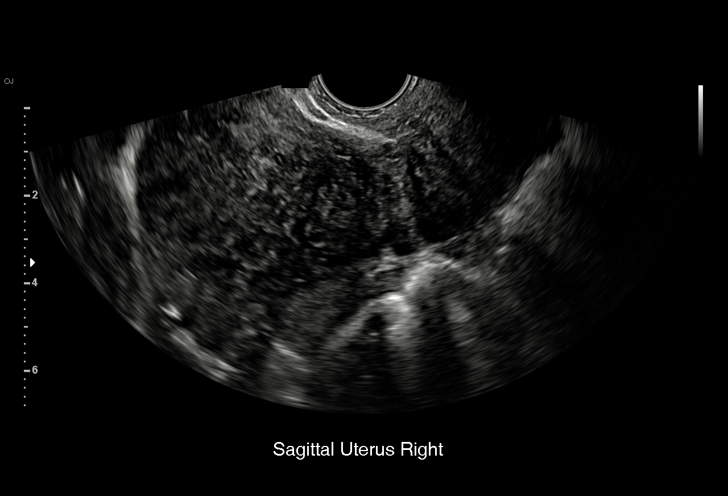
[im 10/43]
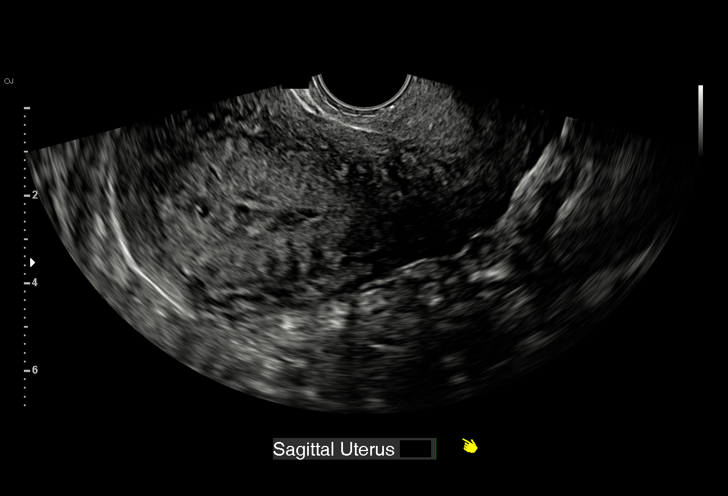
[im 13/43]
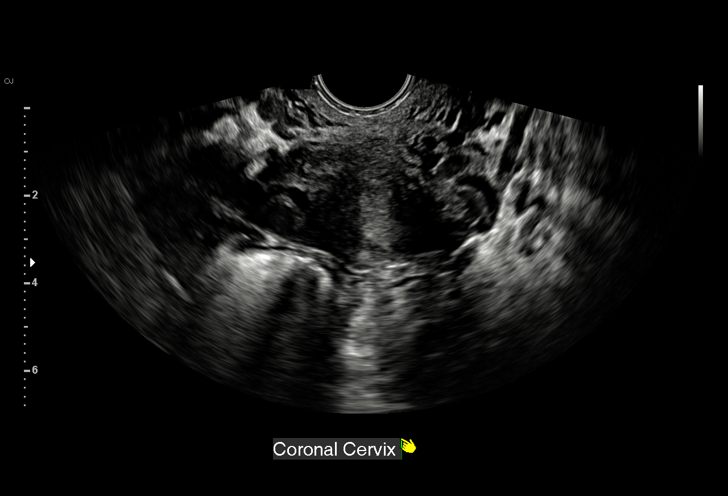
[im 16/43]
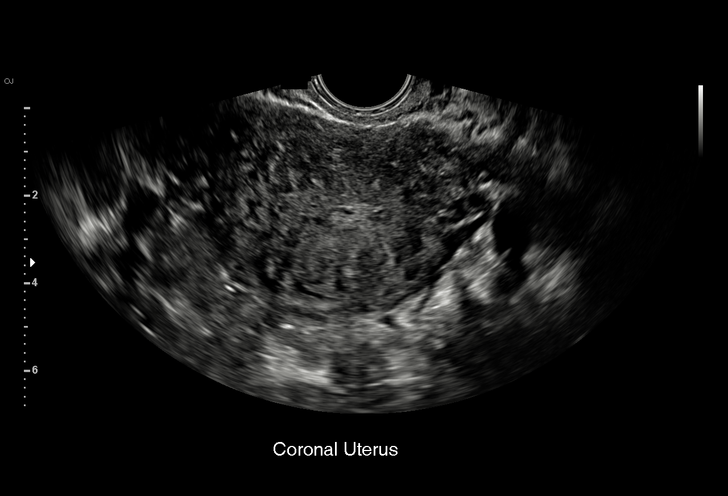
[im 19/43]
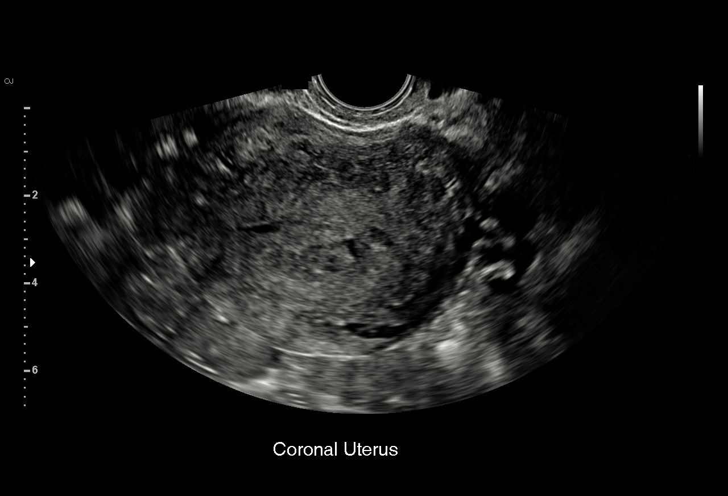
[im 22/43]
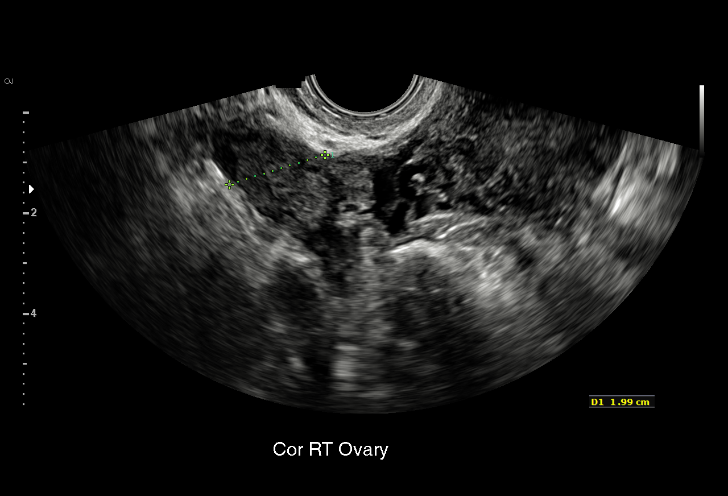
[im 24/43]
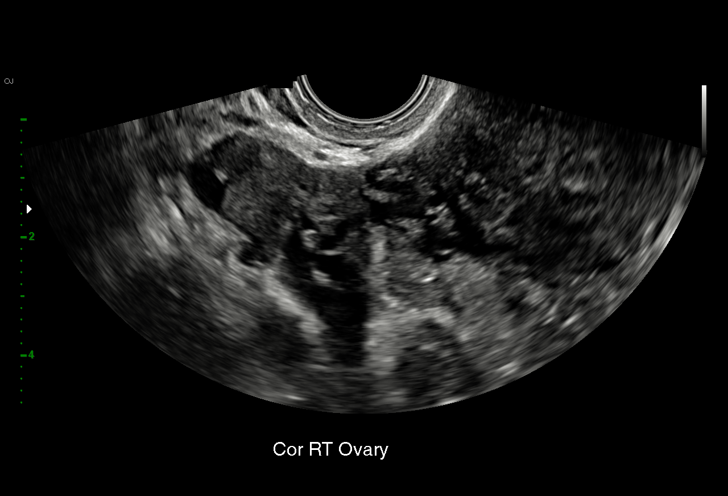
[im 27/43]
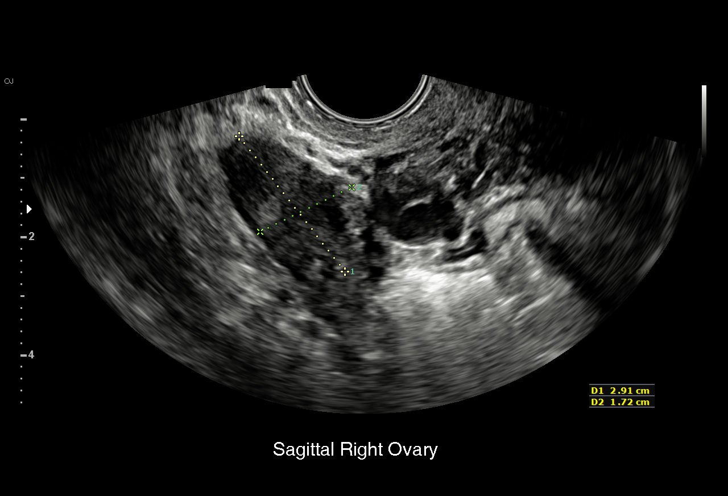
[im 30/43]
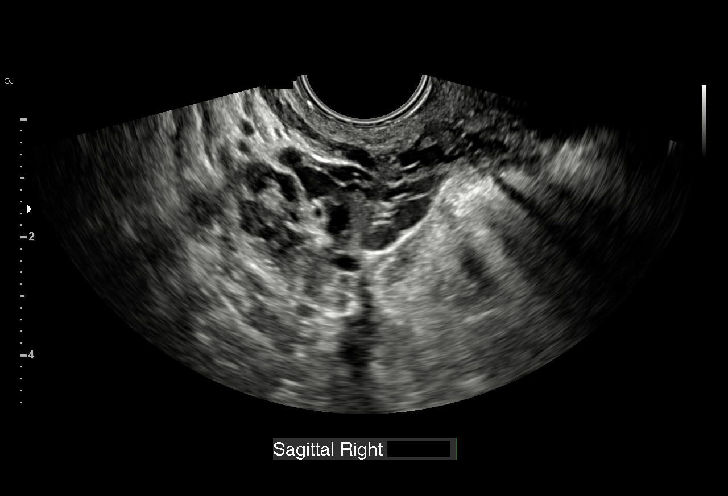
[im 33/43]
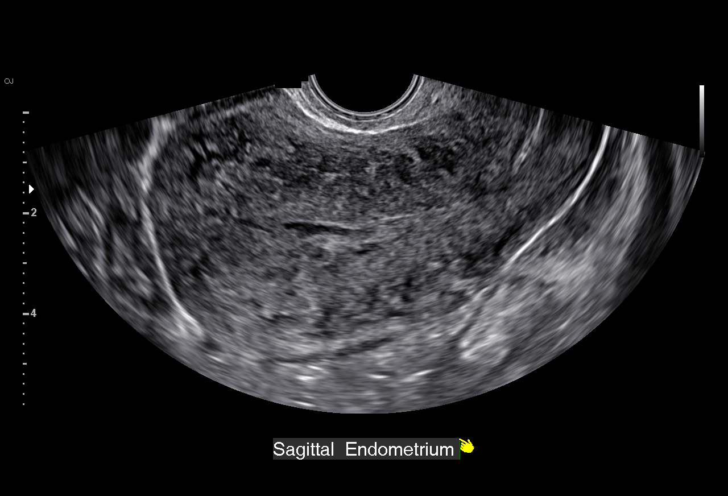
[im 36/43]
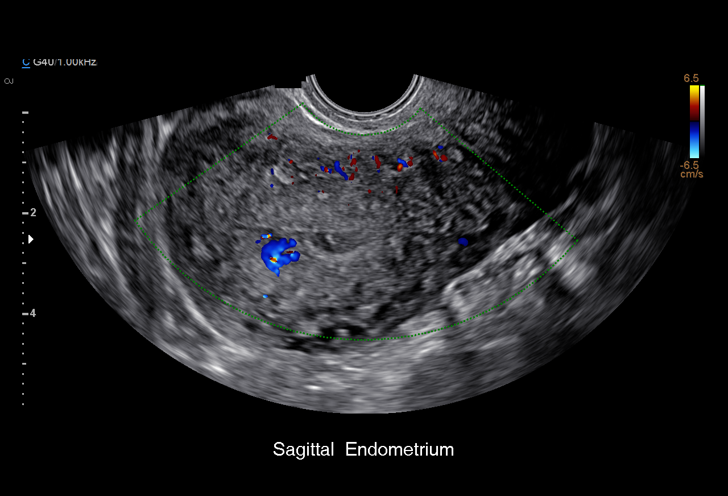
[im 39/43]
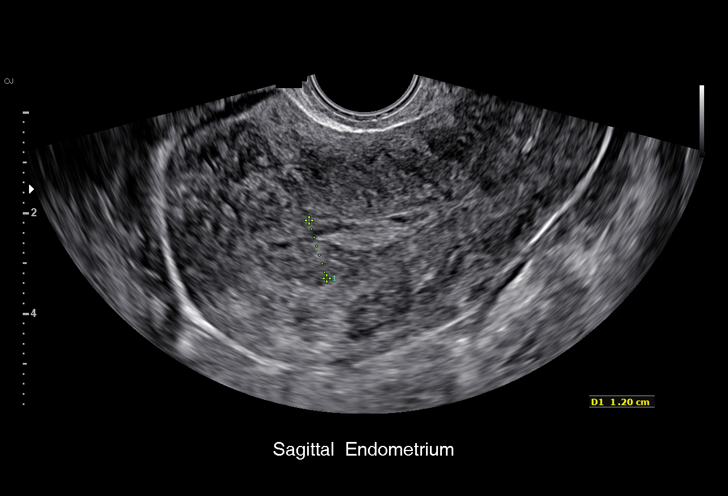
[im 43/43]
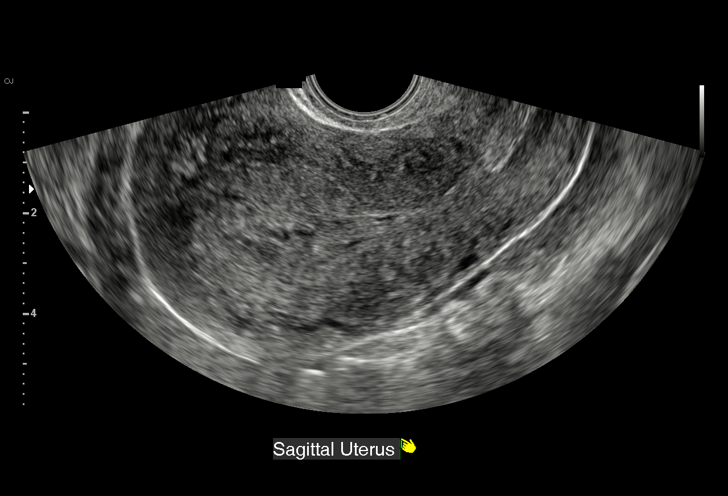

[15 of 28 positions shown; findings below may reference images not displayed]

FINDINGS: Intrauterine gestational sac: None

Yolk sac:  Not visualized

Embryo:  Not visualized

Cardiac Activity:

Heart Rate:  bpm

MSD:   mm    w     d

CRL:     mm    w  d                  US EDC:

Subchorionic hemorrhage:  None visualized.

Maternal uterus/adnexae: Endometrium is heterogeneous, 12 mm in
thickness. Small amount of fluid within the endometrial canal. There
is vascularity noted within the endometrium.
IMPRESSION: No intrauterine pregnancy visualized. There is heterogeneous
endometrium with blood flow. This could reflect retained products of
conception.
